# Patient Record
Sex: Male | Born: 1935 | Race: Black or African American | Hispanic: No | Smoking: Former smoker
Health system: Southern US, Community
[De-identification: ages and names within clinical notes are randomized; demographics above are authoritative.]

## PROBLEM LIST (undated history)

## (undated) DIAGNOSIS — I10 Essential (primary) hypertension: Secondary | ICD-10-CM

## (undated) HISTORY — PX: EYE SURGERY: SHX253

## (undated) HISTORY — DX: Essential (primary) hypertension: I10

---

## 2012-01-03 ENCOUNTER — Ambulatory Visit (INDEPENDENT_AMBULATORY_CARE_PROVIDER_SITE_OTHER): Payer: Medicare Other | Admitting: Family Medicine

## 2012-01-03 VITALS — BP 136/88 | HR 61 | Temp 98.9°F | Resp 18 | Ht 72.0 in | Wt 191.0 lb

## 2012-01-03 DIAGNOSIS — T6391XA Toxic effect of contact with unspecified venomous animal, accidental (unintentional), initial encounter: Secondary | ICD-10-CM

## 2012-01-03 DIAGNOSIS — T63481A Toxic effect of venom of other arthropod, accidental (unintentional), initial encounter: Secondary | ICD-10-CM

## 2012-01-03 DIAGNOSIS — M7989 Other specified soft tissue disorders: Secondary | ICD-10-CM

## 2012-01-03 MED ORDER — PREDNISONE 20 MG PO TABS: ORAL_TABLET | ORAL | Status: DC

## 2012-01-03 MED ORDER — TRIAMCINOLONE ACETONIDE 0.1 % EX CREA: TOPICAL_CREAM | Freq: Two times a day (BID) | CUTANEOUS | Status: DC

## 2012-01-03 NOTE — Patient Instructions (Addendum)
Bee, Wasp, or Hornet Sting Your caregiver has diagnosed you as having an insect sting. An insect sting appears as a red lump in the skin that sometimes has a tiny hole in the center, or it may have a stinger in the center of the wound. The most common stings are from wasps, hornets and bees. Individuals have different reactions to insect stings.  A normal reaction may cause pain, swelling, and redness around the sting site.  A localized allergic reaction may cause swelling and redness that extends beyond the sting site.  A large local reaction may continue to develop over the next 12 to 36 hours.  On occasion, the reactions can be severe (anaphylactic reaction). An anaphylactic reaction may cause wheezing; difficulty breathing; chest pain; fainting; raised, itchy, red patches on the skin; a sick feeling to your stomach (nausea); vomiting; cramping; or diarrhea. If you have had an anaphylactic reaction to an insect sting in the past, you are more likely to have one again. HOME CARE INSTRUCTIONS   With bee stings, a small sac of poison is left in the wound. Brushing across this with something such as a credit card, or anything similar, will help remove this and decrease the amount of the reaction. This same procedure will not help a wasp sting as they do not leave behind a stinger and poison sac.  Apply a cold compress for 10 to 20 minutes every hour for 1 to 2 days, depending on severity, to reduce swelling and itching.  To lessen pain, a paste made of water and baking soda may be rubbed on the bite or sting and left on for 5 minutes.  To relieve itching and swelling, you may use take medication or apply medicated creams or lotions as directed.  Only take over-the-counter or prescription medicines for pain, discomfort, or fever as directed by your caregiver.  Wash the sting site daily with soap and water. Apply antibiotic ointment on the sting site as directed.  If you suffered a severe  reaction:  If you did not require hospitalization, an adult will need to stay with you for 24 hours in case the symptoms return.  You may need to wear a medical bracelet or necklace stating the allergy.  You and your family need to learn when and how to use an anaphylaxis kit or epinephrine injection.  If you have had a severe reaction before, always carry your anaphylaxis kit with you. SEEK MEDICAL CARE IF:   None of the above helps within 2 to 3 days.  The area becomes red, warm, tender, and swollen beyond the area of the bite or sting.  You have an oral temperature above 102 F (38.9 C). SEEK IMMEDIATE MEDICAL CARE IF:  You have symptoms of an allergic reaction which are:  Wheezing.  Difficulty breathing.  Chest pain.  Lightheadedness or fainting.  Itchy, raised, red patches on the skin.  Nausea, vomiting, cramping or diarrhea. ANY OF THESE SYMPTOMS MAY REPRESENT A SERIOUS PROBLEM THAT IS AN EMERGENCY. Do not wait to see if the symptoms will go away. Get medical help right away. Call your local emergency services (911 in [place] DO NOT drive yourself to the hospital. MAKE SURE YOU:   Understand these instructions.  Will watch your condition.  Will get help right away if you are not doing well or get worse. Document Released: 03/19/2005 Document Revised: 06/11/2011 Document Reviewed: 09/03/2009 ExitCare Patient Information 2013 [employer].   TAKE BENADRYL 25 MG (DIPHENHYDRAMINE) ONE EVERY  4-6 HOURS.  I think I mislabled the Prednisone: Prednisone  20 mg :  Take 3 pills daily for 2 days, then 2 daily for 2 days, then 1 daily for 2 days

## 2012-01-03 NOTE — Progress Notes (Signed)
S: Took glove off and something had crawled into the glove and when he put it back on in about 15 min he got bit or stung 3 times before he could get the glove off.  He thought it was a spider.    Healthy.  On amlodipine 5 .  No other prescription meds.  Not diabetic.  O; Right hand swollen and red.  3 sting spots and converge with the swelling  A:  Probable yellowjacket stings  P; Prednisone Benadryl Triamcinolone

## 2012-01-15 ENCOUNTER — Other Ambulatory Visit: Payer: Self-pay | Admitting: Radiology

## 2012-02-20 ENCOUNTER — Telehealth: Payer: Self-pay

## 2012-02-20 ENCOUNTER — Ambulatory Visit (INDEPENDENT_AMBULATORY_CARE_PROVIDER_SITE_OTHER): Payer: Medicare Other | Admitting: Family Medicine

## 2012-02-20 VITALS — BP 117/79 | HR 72 | Temp 98.3°F | Resp 18 | Ht 71.5 in | Wt 202.8 lb

## 2012-02-20 DIAGNOSIS — T63481A Toxic effect of venom of other arthropod, accidental (unintentional), initial encounter: Secondary | ICD-10-CM

## 2012-02-20 DIAGNOSIS — T6391XA Toxic effect of contact with unspecified venomous animal, accidental (unintentional), initial encounter: Secondary | ICD-10-CM

## 2012-02-20 MED ORDER — PREDNISONE 20 MG PO TABS: ORAL_TABLET | ORAL | Status: DC

## 2012-02-20 MED ORDER — TRIAMCINOLONE ACETONIDE 0.1 % EX CREA: TOPICAL_CREAM | Freq: Two times a day (BID) | CUTANEOUS | Status: DC

## 2012-02-20 NOTE — Patient Instructions (Signed)
Take the pills as directed Use the cream twice a day on the rash.

## 2012-02-20 NOTE — Progress Notes (Signed)
Subjective: Patient was doing yard work last week and got stung by yellow jacket is Noah Waters. There were swollen all over the place but only got blood stain. It is continued to be swollen and itching him.  Objective: Puffy area right chin and neck with rash.  Assessment: Yellow jacket sting  Plan: Prednisone Astrid Drafts , Talked about getting a yellow jacket

## 2012-02-20 NOTE — Telephone Encounter (Signed)
Patient states that he was stung by a bee on his chin, and is wanting to know if Dr Alwyn Ren can call in the same prescription that he received in October.   Pharmacy: [facility] on [address]    Pt Phone#:[phone]

## 2012-04-16 ENCOUNTER — Telehealth: Payer: Self-pay

## 2012-04-16 NOTE — Telephone Encounter (Signed)
PT SAYS HE HAS BEEN RECEIVING HIS MEDICINE THROUGH MAIL ORDER AND FOR SOME REASON IT HAS DR. SANDER'S NAME ON IT INSTEAD OF DR. HOPPER.  WANTS TO KNOW IF WE CAN HAVE THIS FIXED.  [facility] NUMBER IS [phone].  YOU CAN REACH THE PT AT [phone]

## 2012-04-17 NOTE — Telephone Encounter (Signed)
It looks like Dr Allyne Gee sent this in for patient, not [place]. I called patient to advise. He states he will come in to see Dr Alwyn Ren to have office visit and labs done.

## 2012-07-15 ENCOUNTER — Ambulatory Visit (INDEPENDENT_AMBULATORY_CARE_PROVIDER_SITE_OTHER): Payer: Medicare Other | Admitting: Family Medicine

## 2012-07-15 VITALS — BP 124/86 | HR 64 | Temp 98.0°F | Resp 16 | Ht 71.0 in | Wt 197.0 lb

## 2012-07-15 DIAGNOSIS — L259 Unspecified contact dermatitis, unspecified cause: Secondary | ICD-10-CM

## 2012-07-15 MED ORDER — METHYLPREDNISOLONE ACETATE 80 MG/ML IJ SUSP
80.0000 mg | Freq: Once | INTRAMUSCULAR | Status: AC
  Administered 2012-07-15: 80 mg via INTRAMUSCULAR

## 2012-07-15 MED ORDER — CLOBETASOL PROPIONATE 0.05 % EX CREA: TOPICAL_CREAM | Freq: Two times a day (BID) | CUTANEOUS | Status: DC

## 2012-07-15 NOTE — Progress Notes (Signed)
Subjective: Patient is here with history of having spread red mulch about a week ago. He scooped it up with his arms. He developed a rash on both arms. The main rash on the arms from the antecubital fossa down represents a punctate maculopapular rash. On the dorsum of the right hand there is an area of linear streaking a little bit more blistering of a rash at the base of the right thumb. This itches somebody just not been going away. He's been using some triamcinolone cream on it  Objective: Rash as described above.  Assessment: Contact dermatitis there is possibly some element of a bite having taken place on his upper forearms.  Plan: Depo-Medrol 80 Clobetasol cream  Return for

## 2012-07-15 NOTE — Patient Instructions (Addendum)
Use the clobetasol cream 2 or 3 times daily on the rash.  Return if problems

## 2012-09-03 ENCOUNTER — Telehealth: Payer: Self-pay

## 2012-09-03 NOTE — Telephone Encounter (Signed)
Patient would like amlodipine called in to [facility] [phone] for refills.

## 2012-09-03 NOTE — Telephone Encounter (Signed)
We do not prescribe this for him  Called him to advise if he wants [place] to do this we must see him for his blood pressure. He will come in tomorrow

## 2012-09-04 ENCOUNTER — Telehealth: Payer: Self-pay

## 2012-09-04 ENCOUNTER — Ambulatory Visit (INDEPENDENT_AMBULATORY_CARE_PROVIDER_SITE_OTHER): Payer: Medicare Other | Admitting: Emergency Medicine

## 2012-09-04 ENCOUNTER — Ambulatory Visit: Payer: Medicare Other

## 2012-09-04 VITALS — BP 112/66 | HR 68 | Temp 97.9°F | Resp 18 | Ht 71.0 in | Wt 192.4 lb

## 2012-09-04 DIAGNOSIS — M25571 Pain in right ankle and joints of right foot: Secondary | ICD-10-CM

## 2012-09-04 DIAGNOSIS — M25579 Pain in unspecified ankle and joints of unspecified foot: Secondary | ICD-10-CM

## 2012-09-04 DIAGNOSIS — S9001XA Contusion of right ankle, initial encounter: Secondary | ICD-10-CM

## 2012-09-04 DIAGNOSIS — S9000XA Contusion of unspecified ankle, initial encounter: Secondary | ICD-10-CM

## 2012-09-04 MED ORDER — MELOXICAM 15 MG PO TABS: 15.0000 mg | ORAL_TABLET | Freq: Every day | ORAL | Status: DC

## 2012-09-04 MED ORDER — AMLODIPINE BESYLATE 5 MG PO TABS: 5.0000 mg | ORAL_TABLET | Freq: Every day | ORAL | Status: DC

## 2012-09-04 NOTE — Patient Instructions (Addendum)
Contusion A contusion is a deep bruise. Contusions are the result of an injury that caused bleeding under the skin. The contusion may turn blue, purple, or yellow. Minor injuries will give you a painless contusion, but more severe contusions may stay painful and swollen for a few weeks.  CAUSES  A contusion is usually caused by a blow, trauma, or direct force to an area of the body. SYMPTOMS   Swelling and redness of the injured area.  Bruising of the injured area.  Tenderness and soreness of the injured area.  Pain. DIAGNOSIS  The diagnosis can be made by taking a history and physical exam. An X-ray, CT scan, or MRI may be needed to determine if there were any associated injuries, such as fractures. TREATMENT  Specific treatment will depend on what area of the body was injured. In general, the best treatment for a contusion is resting, icing, elevating, and applying cold compresses to the injured area. Over-the-counter medicines may also be recommended for pain control. Ask your caregiver what the best treatment is for your contusion. HOME CARE INSTRUCTIONS   Put ice on the injured area.  Put ice in a plastic bag.  Place a towel between your skin and the bag.  Leave the ice on for 15-20 minutes, 3-4 times a day.  Only take over-the-counter or prescription medicines for pain, discomfort, or fever as directed by your caregiver. Your caregiver may recommend avoiding anti-inflammatory medicines (aspirin, ibuprofen, and naproxen) for 48 hours because these medicines may increase bruising.  Rest the injured area.  If possible, elevate the injured area to reduce swelling. SEEK IMMEDIATE MEDICAL CARE IF:   You have increased bruising or swelling.  You have pain that is getting worse.  Your swelling or pain is not relieved with medicines. MAKE SURE YOU:   Understand these instructions.  Will watch your condition.  Will get help right away if you are not doing well or get  worse. Document Released: 12/27/2004 Document Revised: 06/11/2011 Document Reviewed: 01/22/2011 ExitCare Patient Information 2014 [employer].  

## 2012-09-04 NOTE — Progress Notes (Signed)
[facility], [place] [address] [place] [address] [phone]  Date:  09/04/2012   Name:  Noah Waters   DOB:  12/21/1924   MRN:  [id number]  PCP:  Janace Hoard, MD    Chief Complaint: Ankle Pain   History of Present Illness:  Noah Waters is a 79 y.o. very pleasant male patient who presents with the following:  Bumped his ankle a month ago on a tiller and has persistent pain in the lateral ankle.  Says he had a laceration on his lower leg that occurred at the same time and has healed.  Worse when walks.  Some swelling in lateral ankle.  Denies any fever or chills. No improvement with over the counter medications or other home remedies. Denies other complaint or health concern today.   There are no active problems to display for this patient.   Past Medical History  Diagnosis Date  . Hypertension     Past Surgical History  Procedure Laterality Date  . Eye surgery      History  Substance Use Topics  . Smoking status: Former Smoker    Quit date: 04/12/1948  . Smokeless tobacco: Not on file  . Alcohol Use: No    No family history on file.  No Known Allergies  Medication list has been reviewed and updated.  Current Outpatient Prescriptions on File Prior to Visit  Medication Sig Dispense Refill  . amLODipine (NORVASC) 5 MG tablet Take 5 mg by mouth daily.       No current facility-administered medications on file prior to visit.    Review of Systems:  As per HPI, otherwise negative.    Physical Examination: Filed Vitals:   09/04/12 0825  BP: 112/66  Pulse: 68  Temp: 97.9 F (36.6 C)  Resp: 18   Filed Vitals:   09/04/12 0825  Height: 5\' 11"  (1.803 m)  Weight: 192 lb 6.4 oz (87.272 kg)   Body mass index is 26.85 kg/(m^2). Ideal Body Weight: Weight in (lb) to have BMI = 25: 178.9   GEN: WDWN, NAD, Non-toxic, Alert & Oriented x 3 HEENT: Atraumatic, Normocephalic.  Ears and Nose: No external deformity. EXTR: No  clubbing/cyanosis/edema NEURO: Normal gait.  PSYCH: Normally interactive. Conversant. Not depressed or anxious appearing.  Calm demeanor.  ANKLE:  Tender, erythematous lateral malleolus.  No effusion or limitation of movement.  Assessment and Plan: Ankle contusion   Signed,  Phillips Odor, MD   [facility] reading (PRIMARY) by  Dr. Dareen Piano.  negative.

## 2012-09-04 NOTE — Telephone Encounter (Signed)
Spoke w/Dr Dareen Piano, who OK'd RF for 90 days since BP is well-controlled, but pt needs PE/LABS for add'l RFs. LMOM for pt to CB to advise him.

## 2012-09-05 NOTE — Telephone Encounter (Signed)
Spoke with pt and informed rx sent to pharmacy and that he would need to RTC for any additional refills. He understood and will return before he runs out of his meds.

## 2012-11-28 ENCOUNTER — Ambulatory Visit (INDEPENDENT_AMBULATORY_CARE_PROVIDER_SITE_OTHER): Payer: Medicare Other | Admitting: Emergency Medicine

## 2012-11-28 VITALS — BP 126/80 | HR 59 | Temp 98.5°F | Resp 18 | Ht 71.0 in | Wt 198.0 lb

## 2012-11-28 DIAGNOSIS — I10 Essential (primary) hypertension: Secondary | ICD-10-CM | POA: Insufficient documentation

## 2012-11-28 LAB — POCT CBC
Granulocyte percent: 66.7 %G (ref 37–80)
HCT, POC: 51.1 % (ref 43.5–53.7)
Lymph, poc: 1.3 (ref 0.6–3.4)
MCHC: 32.3 g/dL (ref 31.8–35.4)
MCV: 94.4 fL (ref 80–97)
MID (cbc): 0.5 (ref 0–0.9)
POC Granulocyte: 3.5 (ref 2–6.9)
POC LYMPH PERCENT: 23.9 %L (ref 10–50)
Platelet Count, POC: 183 10*3/uL (ref 142–424)
RDW, POC: 13.8 %

## 2012-11-28 LAB — POCT URINALYSIS DIPSTICK
Protein, UA: NEGATIVE
Spec Grav, UA: 1.01
Urobilinogen, UA: 0.2
pH, UA: 6

## 2012-11-28 MED ORDER — AMLODIPINE BESYLATE 5 MG PO TABS: 5.0000 mg | ORAL_TABLET | Freq: Every day | ORAL | Status: DC

## 2012-11-28 NOTE — Progress Notes (Signed)
[facility] [address], [place] [address] [phone]  Date:  11/28/2012   Name:  Noah Waters   DOB:  08/28/1924   MRN:  [id number]  PCP:  Janace Hoard, MD    Chief Complaint: Medication Refill   History of Present Illness:  Noah Waters is a 79 y.o. very pleasant male patient who presents with the following:  For BP check.  Tolerating medication well with no adverse effect. No current complaints.  Appetite good,  Weight stable.     There are no active problems to display for this patient.   Past Medical History  Diagnosis Date  . Hypertension     Past Surgical History  Procedure Laterality Date  . Eye surgery      History  Substance Use Topics  . Smoking status: Former Smoker    Quit date: 04/12/1948  . Smokeless tobacco: Not on file  . Alcohol Use: No    History reviewed. No pertinent family history.  No Known Allergies  Medication list has been reviewed and updated.  Current Outpatient Prescriptions on File Prior to Visit  Medication Sig Dispense Refill  . amLODipine (NORVASC) 5 MG tablet Take 1 tablet (5 mg total) by mouth daily.  90 tablet  0  . Multiple Vitamins-Minerals (MULTIVITAMIN WITH MINERALS) tablet Take 1 tablet by mouth daily.      . Vitamin D, Ergocalciferol, (DRISDOL) 50000 UNITS CAPS Take 50,000 Units by mouth.      . meloxicam (MOBIC) 15 MG tablet Take 1 tablet (15 mg total) by mouth daily.  30 tablet  1   No current facility-administered medications on file prior to visit.    Review of Systems:  As per HPI, otherwise negative.    Physical Examination: Filed Vitals:   11/28/12 1104  BP: 126/80  Pulse: 59  Temp: 98.5 F (36.9 C)  Resp: 18   Filed Vitals:   11/28/12 1104  Height: 5\' 11"  (1.803 m)  Weight: 198 lb (89.812 kg)   Body mass index is 27.63 kg/(m^2). Ideal Body Weight: Weight in (lb) to have BMI = 25: 178.9  GEN: WDWN, NAD, Non-toxic, A & O x 3 HEENT: Atraumatic, Normocephalic. Neck  supple. No masses, No LAD. Ears and Nose: No external deformity. CV: RRR, No M/G/R. No JVD. No thrill. No extra heart sounds. PULM: CTA B, no wheezes, crackles, rhonchi. No retractions. No resp. distress. No accessory muscle use. ABD: S, NT, ND, +BS. No rebound. No HSM. EXTR: No c/c/e NEURO Normal gait.  PSYCH: Normally interactive. Conversant. Not depressed or anxious appearing.  Calm demeanor.    Assessment and Plan: Hypertension Labs Follow up based on labs   Signed,  Phillips Odor, MD

## 2012-11-29 LAB — LIPID PANEL
Cholesterol: 175 mg/dL (ref 0–200)
HDL: 48 mg/dL (ref >39)
Total CHOL/HDL Ratio: 3.6 Ratio
Triglycerides: 47 mg/dL (ref <150)
VLDL: 9 mg/dL (ref 0–40)

## 2012-11-29 LAB — COMPREHENSIVE METABOLIC PANEL
Alkaline Phosphatase: 70 U/L (ref 39–117)
BUN: 27 mg/dL — ABNORMAL HIGH (ref 6–23)
CO2: 28 mEq/L (ref 19–32)
Creat: 1.22 mg/dL (ref 0.50–1.35)
Glucose, Bld: 85 mg/dL (ref 70–99)
Total Bilirubin: 1.2 mg/dL (ref 0.3–1.2)

## 2013-07-15 ENCOUNTER — Other Ambulatory Visit: Payer: Self-pay

## 2013-07-15 MED ORDER — AMLODIPINE BESYLATE 5 MG PO TABS: 5.0000 mg | ORAL_TABLET | Freq: Every day | ORAL | Status: DC

## 2013-10-20 ENCOUNTER — Ambulatory Visit (INDEPENDENT_AMBULATORY_CARE_PROVIDER_SITE_OTHER): Payer: Medicare Other | Admitting: Family Medicine

## 2013-10-20 VITALS — BP 130/70 | HR 60 | Temp 98.8°F | Resp 18 | Ht 70.0 in | Wt 194.2 lb

## 2013-10-20 DIAGNOSIS — Z79899 Other long term (current) drug therapy: Secondary | ICD-10-CM

## 2013-10-20 DIAGNOSIS — I10 Essential (primary) hypertension: Secondary | ICD-10-CM

## 2013-10-20 LAB — CBC
HCT: 45.7 % (ref 39.0–52.0)
HEMOGLOBIN: 15.6 g/dL (ref 13.0–17.0)
MCH: 29.4 pg (ref 26.0–34.0)
MCHC: 34.1 g/dL (ref 30.0–36.0)
MCV: 86.2 fL (ref 78.0–100.0)
Platelets: 161 10*3/uL (ref 150–400)
RBC: 5.3 MIL/uL (ref 4.22–5.81)
RDW: 14.5 % (ref 11.5–15.5)
WBC: 4.4 10*3/uL (ref 4.0–10.5)

## 2013-10-20 LAB — COMPREHENSIVE METABOLIC PANEL
ALBUMIN: 4.4 g/dL (ref 3.5–5.2)
ALK PHOS: 60 U/L (ref 39–117)
ALT: 12 U/L (ref 0–53)
AST: 21 U/L (ref 0–37)
BUN: 9 mg/dL (ref 6–23)
CALCIUM: 9.3 mg/dL (ref 8.4–10.5)
CHLORIDE: 102 meq/L (ref 96–112)
CO2: 29 mEq/L (ref 19–32)
Creat: 1.11 mg/dL (ref 0.50–1.35)
Glucose, Bld: 89 mg/dL (ref 70–99)
POTASSIUM: 4.3 meq/L (ref 3.5–5.3)
Sodium: 140 mEq/L (ref 135–145)
Total Bilirubin: 0.9 mg/dL (ref 0.2–1.2)
Total Protein: 7.1 g/dL (ref 6.0–8.3)

## 2013-10-20 NOTE — Progress Notes (Signed)
Subjective:    Patient ID: Noah ConnorsJerry D Masek, male    DOB: 06/18/1924, 80 y.o.   MRN: [id number]  This chart was scribed for Noah MochaEva N Shaw, MD by Julian HyMorgan Graham, ED Scribe. The patient was seen in Room 10. The patient's care was started at 9:18 AM.   Chief Complaint  Patient presents with  . Medication Refill    amlopipine    HPI Past Medical History  Diagnosis Date  . Hypertension    Current Outpatient Prescriptions on File Prior to Visit  Medication Sig Dispense Refill  . amLODipine (NORVASC) 5 MG tablet Take 1 tablet (5 mg total) by mouth daily. PATIENT NEEDS OFFICE VISIT FOR ADDITIONAL REFILLS  90 tablet  0  . meloxicam (MOBIC) 15 MG tablet Take 1 tablet (15 mg total) by mouth daily.  30 tablet  1  . Multiple Vitamins-Minerals (MULTIVITAMIN WITH MINERALS) tablet Take 1 tablet by mouth daily.      . Vitamin D, Ergocalciferol, (DRISDOL) 50000 UNITS CAPS Take 50,000 Units by mouth.       No current facility-administered medications on file prior to visit.   No Known Allergies HPI Comments: Noah Waters is a 80 y.o. male who [facility]. Pt denies having any problems. Pt states he hasn't eaten today. Pt denies chest pains or tightness, SOB, dizziness, headaches, congestion, leg swelling, fever, chills, nausea and vomiting.   Pt states he has had irregular heart rate for many years. Pt has worked with the [employer] his entire life. Reports he has had to have annual [employer] medical examination due to his history of irregular heart rate. It has always been benign and no intervention has ever been needed. No prior paper chart for pt available. No prior EKG readings available.   Last seen on 11/23/12 by Dr. Alwyn RenHopper. Pt had a complete lab panel.   Review of Systems  Constitutional: Negative for fever and chills.  HENT: Negative for congestion.   Respiratory: Negative for shortness of breath.   Cardiovascular: Negative for chest pain and leg swelling.    Gastrointestinal: Negative for nausea and vomiting.  Neurological: Negative for dizziness, weakness and headaches.      BP 130/70  Pulse 60  Temp(Src) 98.8 F (37.1 C) (Oral)  Resp 18  Ht 5\' 10"  (1.778 m)  Wt 194 lb 3.2 oz (88.089 kg)  BMI 27.86 kg/m2  SpO2 97% Objective:   Physical Exam  Nursing note and vitals reviewed. Constitutional: He is oriented to person, place, and time. He appears well-developed and well-nourished. No distress.  HENT:  Head: Normocephalic and atraumatic.  Eyes: Conjunctivae and EOM are normal.  Neck: Neck supple. No tracheal deviation present.  Cardiovascular: S1 normal and S2 normal.  An irregular rhythm present.  Extrasystoles are present.  Pulmonary/Chest: Effort normal. No respiratory distress.  Musculoskeletal: Normal range of motion.  Neurological: He is alert and oriented to person, place, and time.  Skin: Skin is warm and dry.  Psychiatric: He has a normal mood and affect. His behavior is normal.     Primary EKG Reading by Dr. Clelia CroftShaw.  Assessment & Plan:  COORDINATION OF CARE: 9:18 AM- Depending on [facility] EKG history will decide if he needs an EKG today to establish his baseline. Patient informed of current plan for treatment and evaluation and agrees with plan at this time.  Essential hypertension, benign - Plan: Comprehensive metabolic panel, CBC, PSA, Medicare, EKG 12-Lead  Encounter for  long-term (current) use of other medications - Plan: Comprehensive metabolic panel, CBC, PSA, Medicare, EKG 12-Lead  No orders of the defined types were placed in this encounter.    I personally performed the services described in this documentation, which was scribed in my presence. The recorded information has been reviewed and considered, and addended by me as needed.  Norberto Sorenson, MD MPH

## 2013-10-21 LAB — PSA, MEDICARE: PSA: 2.36 ng/mL (ref <=4.00)

## 2014-01-13 ENCOUNTER — Telehealth: Payer: Self-pay

## 2014-01-13 MED ORDER — AMLODIPINE BESYLATE 5 MG PO TABS: 5.0000 mg | ORAL_TABLET | Freq: Every day | ORAL | Status: DC

## 2014-01-13 NOTE — Telephone Encounter (Signed)
Spoke to pt- he needed his Norvasc refilled. Sent in 3 month supply- pt will have appt in January.

## 2014-01-13 NOTE — Telephone Encounter (Signed)
Pt states his mail order pharmacy has sent over several requests for renewal of his meds but that we have not responded. Pt is running out of meds,  Please call pt at [phone]

## 2014-02-17 ENCOUNTER — Encounter: Payer: Self-pay | Admitting: Family Medicine

## 2014-06-05 ENCOUNTER — Ambulatory Visit (INDEPENDENT_AMBULATORY_CARE_PROVIDER_SITE_OTHER): Payer: Medicare Other | Admitting: Internal Medicine

## 2014-06-05 VITALS — BP 160/86 | HR 59 | Temp 97.6°F | Resp 16 | Ht 71.0 in | Wt 191.2 lb

## 2014-06-05 DIAGNOSIS — I10 Essential (primary) hypertension: Secondary | ICD-10-CM

## 2014-06-05 MED ORDER — AMLODIPINE BESYLATE 5 MG PO TABS: 5.0000 mg | ORAL_TABLET | Freq: Every day | ORAL | Status: DC

## 2014-06-05 NOTE — Progress Notes (Signed)
   Subjective:    Patient ID: Noah Waters, male    DOB: 03/01/1925, 81 y.o.   MRN: [id number] This chart was scribed for Tonye Pearsonobert P Makalyn Lennox, MD by [place] Peace, ED Scribe. The patient was seen in RM11. The patient's care was started at 9:17 AM.  HPI HPI Comments: Noah Waters is a 81 y.o. male who [facility] the [facility].  Patient Active Problem List   Diagnosis Date Noted  . Essential hypertension, benign 11/28/2012   Medication  amLODipine (NORVASC) 5 MG tablet    He is doing very well and has no current symptoms. Very optimistic about life. Former [employer]. Attributes his good health to" love" Some relatives over 100. Despite the fact that the computer does not have records about his health maintenance, he insists that he has up-to-date on tetanus, colonoscopy, pneumonia vaccine, and flu vaccine. 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  Review of Systems  Constitutional: Negative for activity change, appetite change, fatigue and unexpected weight change.  HENT: Negative for trouble swallowing.   Eyes: Negative for visual disturbance.  Respiratory: Negative for cough and shortness of breath.   Cardiovascular: Negative for chest pain, palpitations and leg swelling.  Gastrointestinal: Negative for abdominal pain.  Genitourinary: Negative for difficulty urinating.  Neurological: Negative for speech difficulty, weakness and headaches.  Psychiatric/Behavioral: Negative for behavioral problems and sleep disturbance.       Objective:   Physical Exam BP 160/86 mmHg  Pulse 59  Temp(Src) 97.6 F (36.4 C) (Oral)  Resp 16  Ht 5\' 11"  (1.803 m)  Wt 191 lb 3.2 oz (86.728 kg)  BMI 26.68 kg/m2  SpO2 96% HEENT clear Heart regular without murmur No carotid bruits Extremities without edema Good peripheral pulses Cranial nerves II through XII intact Mood good affect appropriate       Assessment & Plan:  I have completed  the patient encounter in its entirety as documented by the scribe, with editing by me where necessary. Augustine Brannick P. Merla Richesoolittle, M.D.  Essential hypertension, benign    Meds ordered this encounter  Medications  . amLODipine (NORVASC) 5 MG tablet    Sig: Take 1 tablet (5 mg total) by mouth daily.    Dispense:  90 tablet    Refill:  3   There should be no need for lab work at this visit Follow-up one year or sooner for any complaint It would be nice to add his outside records

## 2014-06-07 ENCOUNTER — Ambulatory Visit: Payer: Self-pay | Admitting: Family Medicine

## 2014-12-14 IMAGING — CR DG ANKLE COMPLETE 3+V*R*
3 series · 3 of 3 positions shown · non-contrast
Comparison: None.

CLINICAL DATA: Ankle swelling

RIGHT ANKLE - COMPLETE 3+ VIEW

[AP]
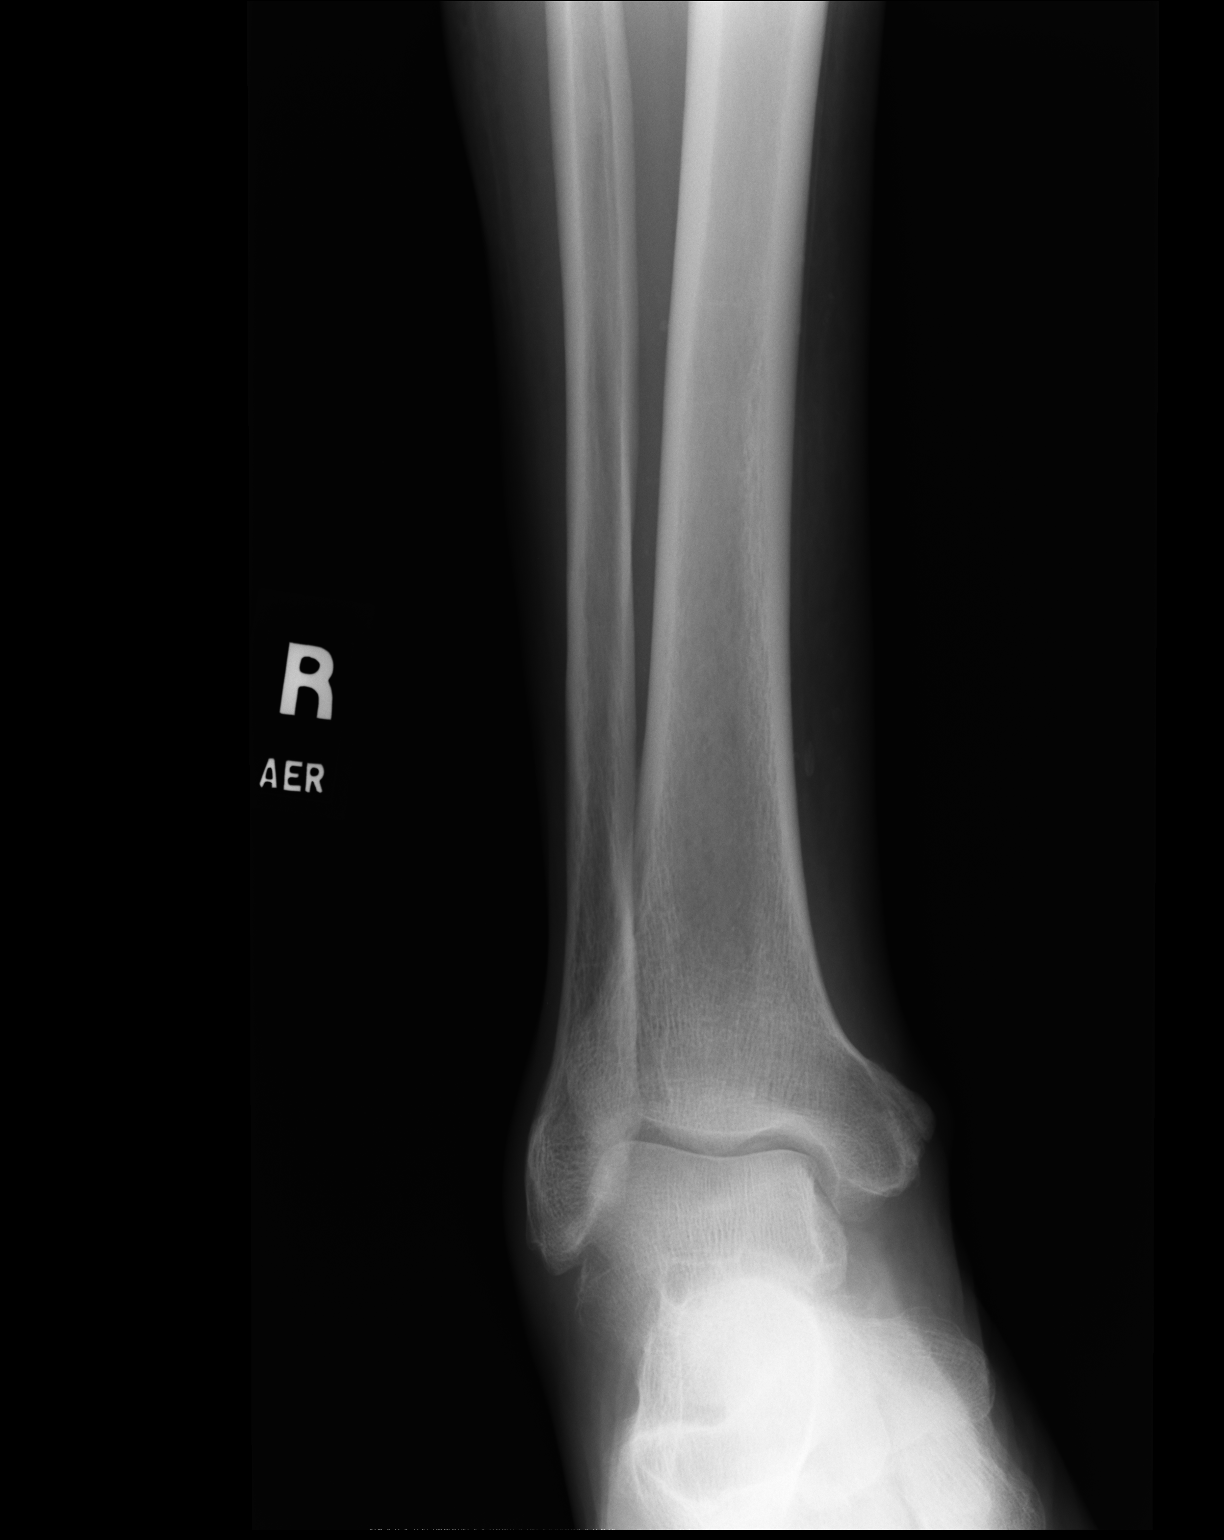

[ap obl int rot]
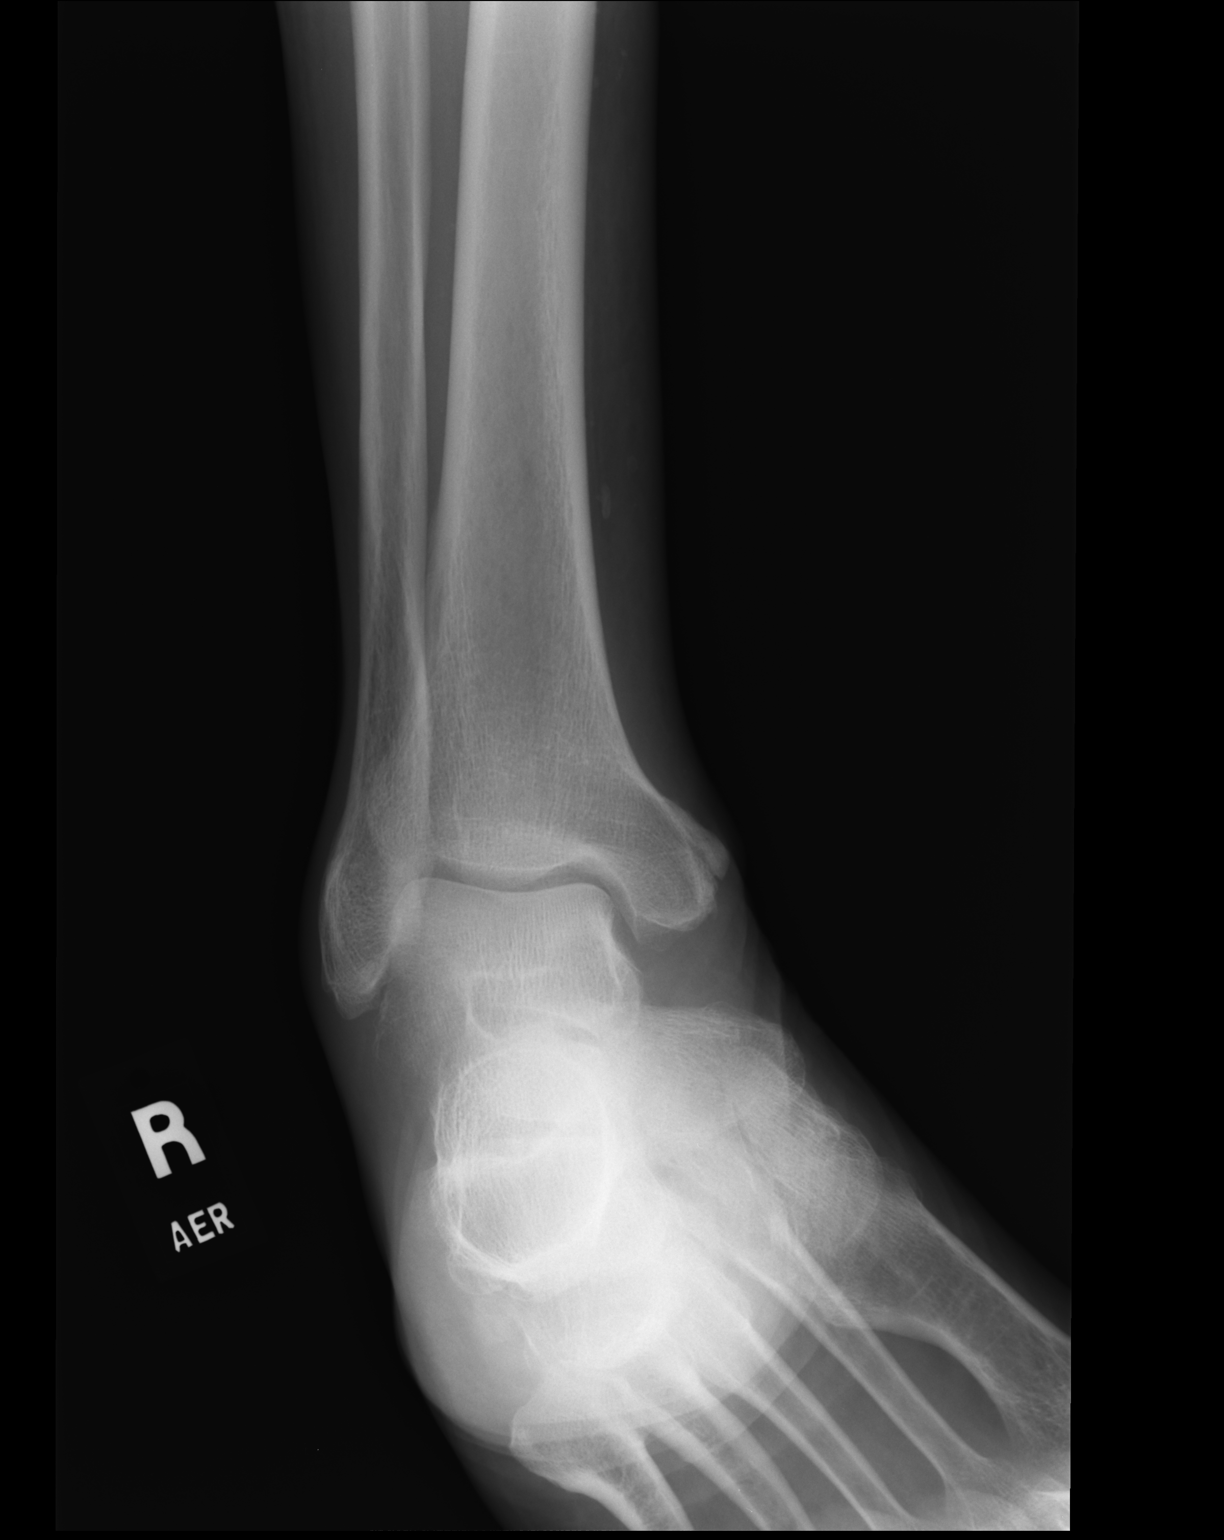

[lateral]
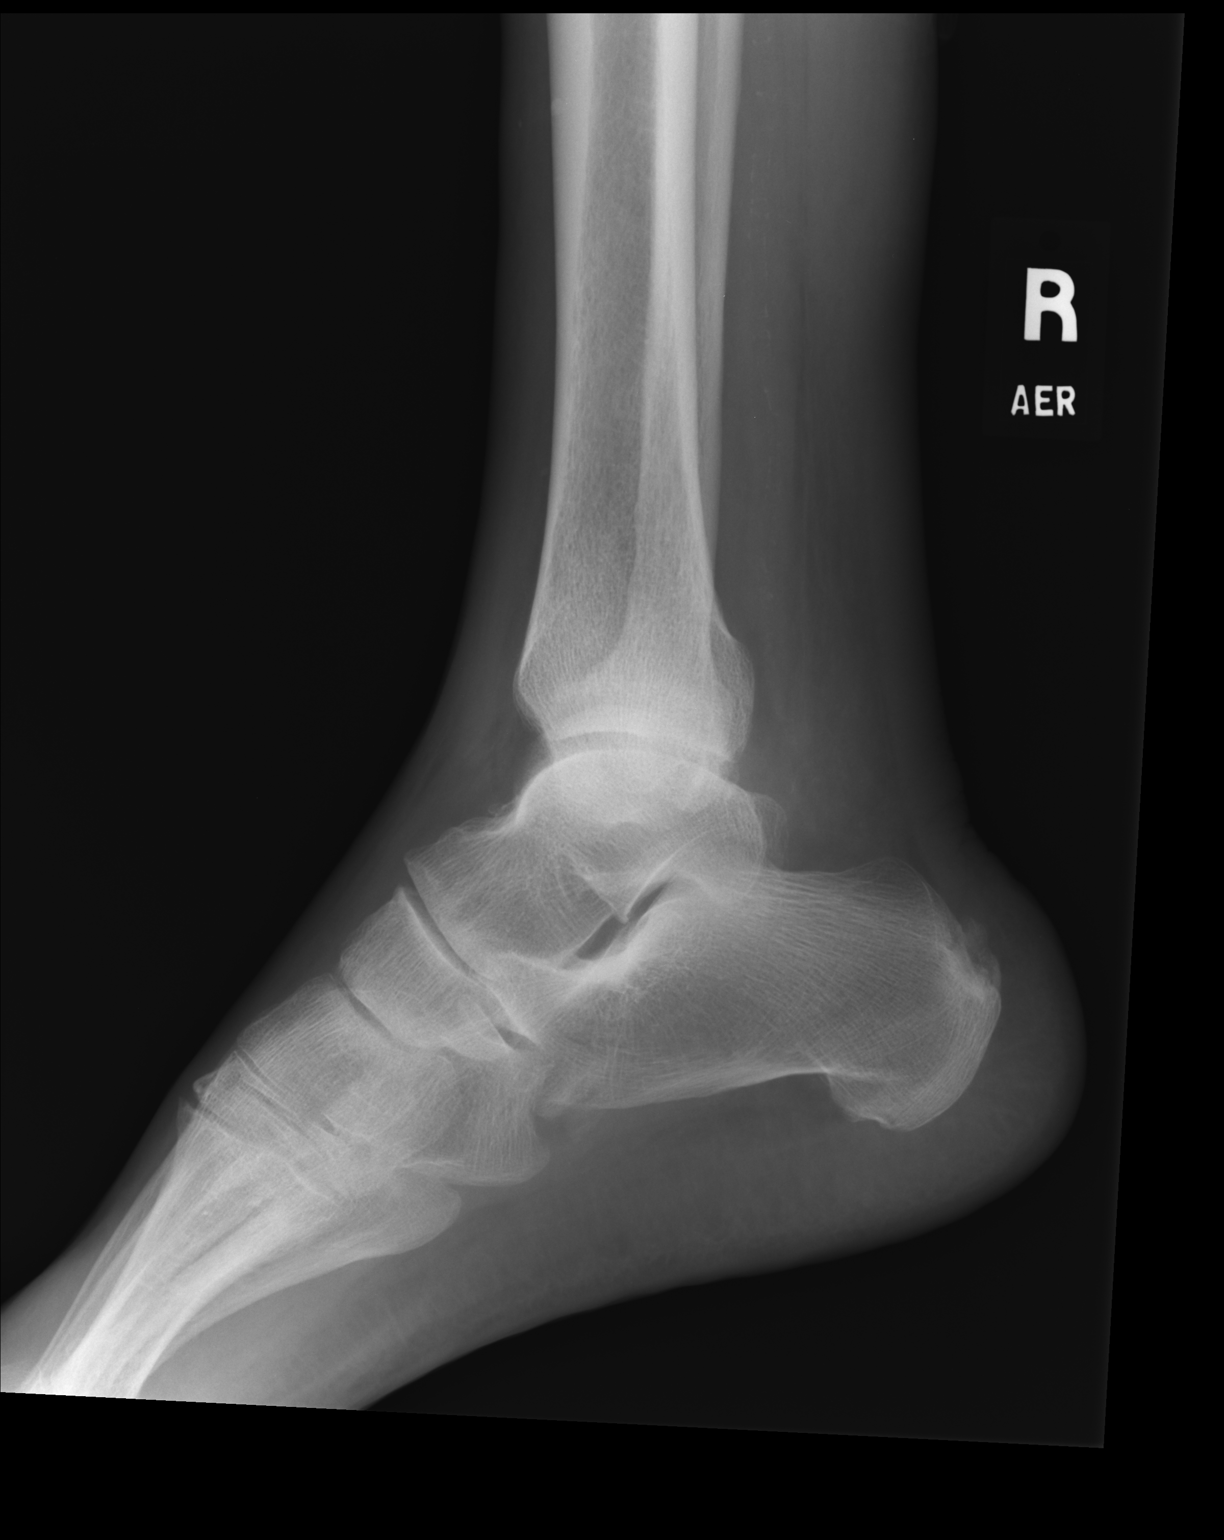

[3 of 3 positions shown; findings below may reference images not displayed]

FINDINGS: Frontal, oblique, and lateral views were obtained.  There
is no fracture or effusion.  Ankle mortise appears intact.  No
erosive change.  There is minimal spurring arising from the
inferior and posterior calcaneus.
IMPRESSION: No fracture.  Mortise intact.

## 2015-01-23 DIAGNOSIS — Z23 Encounter for immunization: Secondary | ICD-10-CM | POA: Diagnosis not present

## 2015-03-14 DIAGNOSIS — S76912A Strain of unspecified muscles, fascia and tendons at thigh level, left thigh, initial encounter: Secondary | ICD-10-CM | POA: Diagnosis not present

## 2015-03-21 ENCOUNTER — Other Ambulatory Visit (HOSPITAL_COMMUNITY): Payer: Self-pay | Admitting: Orthopedic Surgery

## 2015-03-21 DIAGNOSIS — M79662 Pain in left lower leg: Secondary | ICD-10-CM

## 2015-03-21 DIAGNOSIS — S76912D Strain of unspecified muscles, fascia and tendons at thigh level, left thigh, subsequent encounter: Secondary | ICD-10-CM | POA: Diagnosis not present

## 2015-03-21 DIAGNOSIS — M7989 Other specified soft tissue disorders: Principal | ICD-10-CM

## 2015-03-22 ENCOUNTER — Ambulatory Visit (HOSPITAL_COMMUNITY)
Admission: RE | Admit: 2015-03-22 | Discharge: 2015-03-22 | Disposition: A | Discharge status: Home / Self Care | Payer: Medicare Other | Source: Ambulatory Visit | Attending: Orthopedic Surgery | Admitting: Orthopedic Surgery

## 2015-03-22 DIAGNOSIS — M79662 Pain in left lower leg: Secondary | ICD-10-CM

## 2015-03-22 DIAGNOSIS — M7989 Other specified soft tissue disorders: Secondary | ICD-10-CM | POA: Diagnosis not present

## 2015-03-22 DIAGNOSIS — M79605 Pain in left leg: Secondary | ICD-10-CM | POA: Diagnosis not present

## 2015-03-22 NOTE — Progress Notes (Signed)
VASCULAR LAB PRELIMINARY  PRELIMINARY  PRELIMINARY  PRELIMINARY  Left lower extremity venous duplex completed.    Preliminary report:  Left:  No evidence of DVT, superficial thrombosis, or Baker's cyst.  Dalton Mille, RVS 03/22/2015, 9:31 AM

## 2015-04-06 DIAGNOSIS — M79605 Pain in left leg: Secondary | ICD-10-CM | POA: Diagnosis not present

## 2015-04-21 ENCOUNTER — Ambulatory Visit (INDEPENDENT_AMBULATORY_CARE_PROVIDER_SITE_OTHER): Payer: Medicare Other | Admitting: Emergency Medicine

## 2015-04-21 VITALS — BP 124/62 | HR 63 | Temp 98.5°F | Resp 18 | Ht 71.0 in | Wt 175.0 lb

## 2015-04-21 DIAGNOSIS — I10 Essential (primary) hypertension: Secondary | ICD-10-CM

## 2015-04-21 DIAGNOSIS — M25562 Pain in left knee: Secondary | ICD-10-CM

## 2015-04-21 MED ORDER — MELOXICAM 15 MG PO TABS: 15.0000 mg | ORAL_TABLET | Freq: Every day | ORAL | Status: DC

## 2015-04-21 MED ORDER — AMLODIPINE BESYLATE 5 MG PO TABS: 5.0000 mg | ORAL_TABLET | Freq: Every day | ORAL | Status: DC

## 2015-04-21 MED ORDER — TRAMADOL HCL 50 MG PO TABS: 50.0000 mg | ORAL_TABLET | Freq: Three times a day (TID) | ORAL | Status: DC | PRN

## 2015-04-21 NOTE — Patient Instructions (Signed)
Hypertension Hypertension, commonly called high blood pressure, is when the force of blood pumping through your arteries is too strong. Your arteries are the blood vessels that carry blood from your heart throughout your body. A blood pressure reading consists of a higher number over a lower number, such as 110/72. The higher number (systolic) is the pressure inside your arteries when your heart pumps. The lower number (diastolic) is the pressure inside your arteries when your heart relaxes. Ideally you want your blood pressure below 120/80. Hypertension forces your heart to work harder to pump blood. Your arteries may become narrow or stiff. Having untreated or uncontrolled hypertension can cause heart attack, stroke, kidney disease, and other problems. RISK FACTORS Some risk factors for high blood pressure are controllable. Others are not.  Risk factors you cannot control include:   Race. You may be at higher risk if you are African American.  Age. Risk increases with age.  Gender. Men are at higher risk than women before age 45 years. After age 65, women are at higher risk than men. Risk factors you can control include:  Not getting enough exercise or physical activity.  Being overweight.  Getting too much fat, sugar, calories, or salt in your diet.  Drinking too much alcohol. SIGNS AND SYMPTOMS Hypertension does not usually cause signs or symptoms. Extremely high blood pressure (hypertensive crisis) may cause headache, anxiety, shortness of breath, and nosebleed. DIAGNOSIS To check if you have hypertension, your health care provider will measure your blood pressure while you are seated, with your arm held at the level of your heart. It should be measured at least twice using the same arm. Certain conditions can cause a difference in blood pressure between your right and left arms. A blood pressure reading that is higher than normal on one occasion does not mean that you need treatment. If  it is not clear whether you have high blood pressure, you may be asked to return on a different day to have your blood pressure checked again. Or, you may be asked to monitor your blood pressure at home for 1 or more weeks. TREATMENT Treating high blood pressure includes making lifestyle changes and possibly taking medicine. Living a healthy lifestyle can help lower high blood pressure. You may need to change some of your habits. Lifestyle changes may include:  Following the DASH diet. This diet is high in fruits, vegetables, and whole grains. It is low in salt, red meat, and added sugars.  Keep your sodium intake below 2,300 mg per day.  Getting at least 30-45 minutes of aerobic exercise at least 4 times per week.  Losing weight if necessary.  Not smoking.  Limiting alcoholic beverages.  Learning ways to reduce stress. Your health care provider may prescribe medicine if lifestyle changes are not enough to get your blood pressure under control, and if one of the following is true:  You are 18-59 years of age and your systolic blood pressure is above 140.  You are 60 years of age or older, and your systolic blood pressure is above 150.  Your diastolic blood pressure is above 90.  You have diabetes, and your systolic blood pressure is over 140 or your diastolic blood pressure is over 90.  You have kidney disease and your blood pressure is above 140/90.  You have heart disease and your blood pressure is above 140/90. Your personal target blood pressure may vary depending on your medical conditions, your age, and other factors. HOME CARE INSTRUCTIONS    Have your blood pressure rechecked as directed by your health care provider.   Take medicines only as directed by your health care provider. Follow the directions carefully. Blood pressure medicines must be taken as prescribed. The medicine does not work as well when you skip doses. Skipping doses also puts you at risk for  problems.  Do not smoke.   Monitor your blood pressure at home as directed by your health care provider. SEEK MEDICAL CARE IF:   You think you are having a reaction to medicines taken.  You have recurrent headaches or feel dizzy.  You have swelling in your ankles.  You have trouble with your vision. SEEK IMMEDIATE MEDICAL CARE IF:  You develop a severe headache or confusion.  You have unusual weakness, numbness, or feel faint.  You have severe chest or abdominal pain.  You vomit repeatedly.  You have trouble breathing. MAKE SURE YOU:   Understand these instructions.  Will watch your condition.  Will get help right away if you are not doing well or get worse.   This information is not intended to replace advice given to you by your health care provider. Make sure you discuss any questions you have with your health care provider.   Document Released: 03/19/2005 Document Revised: 08/03/2014 Document Reviewed: 01/09/2013 Elsevier Interactive Patient Education 2016 Elsevier Inc.  

## 2015-04-21 NOTE — Progress Notes (Signed)
Subjective:  Patient ID: Noah Waters, male    DOB: 1924-04-26  Age: 82 y.o. MRN: [id number]  CC: Medication Refill and Knee Pain   HPI SHAMAN MUSCARELLA presents   He has a history of hypertension and needs refill on his medication. He has a long-standing injury to his knee that was evaluated by orthopedics and is been  Under treatment with Dr. Nolen Mu. He's been taking Mobic with no long-term relief. He has no history of reinjury.   He has not been veryCompliant with follow-up. He has no swelling  Or effusion. He has full active and passive range of motion  History Jalene has a past medical history of Hypertension.   He has past surgical history that includes Eye surgery.   His  family history is not on file.  He   reports that he quit smoking about 67 years ago. He does not have any smokeless tobacco history on file. He reports that he does not drink alcohol or use illicit drugs.  Outpatient Prescriptions Prior to Visit  Medication Sig Dispense Refill  . amLODipine (NORVASC) 5 MG tablet Take 1 tablet (5 mg total) by mouth daily. 90 tablet 3   No facility-administered medications prior to visit.    Social History   Social History  . Marital Status: Widowed    Spouse Name: N/A  . Number of Children: N/A  . Years of Education: N/A   Social History Main Topics  . Smoking status: Former Smoker    Quit date: 04/12/1948  . Smokeless tobacco: None  . Alcohol Use: No  . Drug Use: No  . Sexual Activity: Not Asked   Other Topics Concern  . None   Social History Narrative     Review of Systems  Constitutional: Negative for fever, chills and appetite change.  HENT: Negative for congestion, ear pain, postnasal drip, sinus pressure and sore throat.   Eyes: Negative for pain and redness.  Respiratory: Negative for cough, shortness of breath and wheezing.   Cardiovascular: Negative for leg swelling.  Gastrointestinal: Negative for nausea, vomiting, abdominal pain, diarrhea,  constipation and blood in stool.  Endocrine: Negative for polyuria.  Genitourinary: Negative for dysuria, urgency, frequency and flank pain.  Musculoskeletal: Positive for arthralgias. Negative for gait problem.  Skin: Negative for rash.  Neurological: Negative for weakness and headaches.  Psychiatric/Behavioral: Negative for confusion and decreased concentration. The patient is not nervous/anxious.     Objective:  BP 124/62 mmHg  Pulse 63  Temp(Src) 98.5 F (36.9 C)  Resp 18  Ht  (1.803 m)  Wt 175 lb (79.379 kg)  BMI 24.42 kg/m2  SpO2 96%  Physical Exam  Constitutional: He is oriented to person, place, and time. He appears well-developed and well-nourished. No distress.  HENT:  Head: Normocephalic and atraumatic.  Right Ear: External ear normal.  Left Ear: External ear normal.  Nose: Nose normal.  Eyes: Conjunctivae and EOM are normal. Pupils are equal, round, and reactive to light. No scleral icterus.  Neck: Normal range of motion. Neck supple. No tracheal deviation present.  Cardiovascular: Normal rate, regular rhythm and normal heart sounds.   Pulmonary/Chest: Effort normal. No respiratory distress. He has no wheezes. He has no rales.  Abdominal: He exhibits no mass. There is no tenderness. There is no rebound and no guarding.  Musculoskeletal: He exhibits no edema.  Lymphadenopathy:    He has no cervical adenopathy.  Neurological: He is alert and oriented to person, place, and time.  Coordination normal.  Skin: Skin is warm and dry. No rash noted.  Psychiatric: He has a normal mood and affect. His behavior is normal.      Assessment & Plan:   Brendyn was seen today for medication refill and knee pain.  Diagnoses and all orders for this visit:  Essential hypertension, benign  Left knee pain  Other orders -     amLODipine (NORVASC) 5 MG tablet; Take 1 tablet (5 mg total) by mouth daily. -     meloxicam (MOBIC) 15 MG tablet; Take 1 tablet (15 mg total) by  mouth daily. -     traMADol (ULTRAM) 50 MG tablet; Take 1 tablet (50 mg total) by mouth every 8 (eight) hours as needed.  I have changed Mr. Hally's meloxicam. I am also having him start on traMADol. Additionally, I am having him maintain his amLODipine.  Meds ordered this encounter  Medications  . DISCONTD: meloxicam (MOBIC) 15 MG tablet    Sig: Take 15 mg by mouth daily.  Marland Kitchen amLODipine (NORVASC) 5 MG tablet    Sig: Take 1 tablet (5 mg total) by mouth daily.    Dispense:  90 tablet    Refill:  3  . meloxicam (MOBIC) 15 MG tablet    Sig: Take 1 tablet (15 mg total) by mouth daily.    Dispense:  30 tablet    Refill:  5  . traMADol (ULTRAM) 50 MG tablet    Sig: Take 1 tablet (50 mg total) by mouth every 8 (eight) hours as needed.    Dispense:  30 tablet    Refill:  0     I suggested he follow back up with the orthopedic surgeon  Appropriate red flag conditions were discussed with the patient as well as actions that should be taken.  Patient expressed his understanding.  Follow-up: Return if symptoms worsen or fail to improve.  Carmelina Dane, MD

## 2015-05-05 DIAGNOSIS — M25561 Pain in right knee: Secondary | ICD-10-CM | POA: Diagnosis not present

## 2015-05-05 DIAGNOSIS — M170 Bilateral primary osteoarthritis of knee: Secondary | ICD-10-CM | POA: Diagnosis not present

## 2015-05-05 DIAGNOSIS — M25562 Pain in left knee: Secondary | ICD-10-CM | POA: Diagnosis not present

## 2015-05-10 ENCOUNTER — Other Ambulatory Visit: Payer: Self-pay | Admitting: Emergency Medicine

## 2015-05-10 NOTE — Telephone Encounter (Signed)
If he continues to need this medication, he needs to RTC or see his PCP for re-evaluation.

## 2015-05-11 DIAGNOSIS — M170 Bilateral primary osteoarthritis of knee: Secondary | ICD-10-CM | POA: Diagnosis not present

## 2015-05-11 DIAGNOSIS — M1712 Unilateral primary osteoarthritis, left knee: Secondary | ICD-10-CM | POA: Diagnosis not present

## 2015-05-11 DIAGNOSIS — M5430 Sciatica, unspecified side: Secondary | ICD-10-CM | POA: Diagnosis not present

## 2015-05-11 DIAGNOSIS — R2689 Other abnormalities of gait and mobility: Secondary | ICD-10-CM | POA: Diagnosis not present

## 2015-05-11 DIAGNOSIS — M25562 Pain in left knee: Secondary | ICD-10-CM | POA: Diagnosis not present

## 2015-05-18 DIAGNOSIS — M25562 Pain in left knee: Secondary | ICD-10-CM | POA: Diagnosis not present

## 2015-05-18 DIAGNOSIS — R2689 Other abnormalities of gait and mobility: Secondary | ICD-10-CM | POA: Diagnosis not present

## 2015-05-18 DIAGNOSIS — M25561 Pain in right knee: Secondary | ICD-10-CM | POA: Diagnosis not present

## 2015-05-18 DIAGNOSIS — M1711 Unilateral primary osteoarthritis, right knee: Secondary | ICD-10-CM | POA: Diagnosis not present

## 2015-05-18 DIAGNOSIS — M170 Bilateral primary osteoarthritis of knee: Secondary | ICD-10-CM | POA: Diagnosis not present

## 2015-05-25 DIAGNOSIS — M5430 Sciatica, unspecified side: Secondary | ICD-10-CM | POA: Diagnosis not present

## 2015-05-25 DIAGNOSIS — M25562 Pain in left knee: Secondary | ICD-10-CM | POA: Diagnosis not present

## 2015-05-25 DIAGNOSIS — R2689 Other abnormalities of gait and mobility: Secondary | ICD-10-CM | POA: Diagnosis not present

## 2015-05-25 DIAGNOSIS — M25561 Pain in right knee: Secondary | ICD-10-CM | POA: Diagnosis not present

## 2015-05-25 DIAGNOSIS — M1712 Unilateral primary osteoarthritis, left knee: Secondary | ICD-10-CM | POA: Diagnosis not present

## 2015-06-06 DIAGNOSIS — M5430 Sciatica, unspecified side: Secondary | ICD-10-CM | POA: Diagnosis not present

## 2015-06-06 DIAGNOSIS — M25561 Pain in right knee: Secondary | ICD-10-CM | POA: Diagnosis not present

## 2015-06-06 DIAGNOSIS — M170 Bilateral primary osteoarthritis of knee: Secondary | ICD-10-CM | POA: Diagnosis not present

## 2015-06-06 DIAGNOSIS — R2689 Other abnormalities of gait and mobility: Secondary | ICD-10-CM | POA: Diagnosis not present

## 2015-06-06 DIAGNOSIS — M1711 Unilateral primary osteoarthritis, right knee: Secondary | ICD-10-CM | POA: Diagnosis not present

## 2015-06-06 DIAGNOSIS — M25562 Pain in left knee: Secondary | ICD-10-CM | POA: Diagnosis not present

## 2015-06-10 DIAGNOSIS — M79651 Pain in right thigh: Secondary | ICD-10-CM | POA: Diagnosis not present

## 2015-06-10 DIAGNOSIS — M79652 Pain in left thigh: Secondary | ICD-10-CM | POA: Diagnosis not present

## 2015-06-15 DIAGNOSIS — M79652 Pain in left thigh: Secondary | ICD-10-CM | POA: Diagnosis not present

## 2015-06-15 DIAGNOSIS — M79651 Pain in right thigh: Secondary | ICD-10-CM | POA: Diagnosis not present

## 2015-06-21 DIAGNOSIS — M79652 Pain in left thigh: Secondary | ICD-10-CM | POA: Diagnosis not present

## 2015-06-21 DIAGNOSIS — M79651 Pain in right thigh: Secondary | ICD-10-CM | POA: Diagnosis not present

## 2015-06-24 DIAGNOSIS — M79652 Pain in left thigh: Secondary | ICD-10-CM | POA: Diagnosis not present

## 2015-06-24 DIAGNOSIS — M79651 Pain in right thigh: Secondary | ICD-10-CM | POA: Diagnosis not present

## 2015-06-28 DIAGNOSIS — M79651 Pain in right thigh: Secondary | ICD-10-CM | POA: Diagnosis not present

## 2015-06-28 DIAGNOSIS — M79652 Pain in left thigh: Secondary | ICD-10-CM | POA: Diagnosis not present

## 2016-01-03 DIAGNOSIS — Z23 Encounter for immunization: Secondary | ICD-10-CM | POA: Diagnosis not present

## 2016-05-17 ENCOUNTER — Telehealth: Payer: Self-pay

## 2016-05-17 MED ORDER — AMLODIPINE BESYLATE 5 MG PO TABS
5.0000 mg | ORAL_TABLET | Freq: Every day | ORAL | 0 refills | Status: DC
Start: 2016-05-17 — End: 2016-07-30

## 2016-05-17 NOTE — Telephone Encounter (Signed)
THIS MESSAGE IS FROM ASHLEY AT [facility]: PATIENT IS REQUESTING A REFILL ON AMLODIPINE 5 MG. HE TAKES 1 PILL DAILY. LAST REFILLED BY DR. Phillips OdorJEFFERY ANDERSON. BEST PHONE [phone] ([facility] [address] AND ASK FOR ASHLEY-THE PHARMACIST) MBC

## 2016-07-24 ENCOUNTER — Other Ambulatory Visit: Payer: Self-pay | Admitting: Family Medicine

## 2016-07-29 ENCOUNTER — Other Ambulatory Visit: Payer: Self-pay | Admitting: Family Medicine

## 2016-07-30 ENCOUNTER — Encounter: Payer: Self-pay | Admitting: Family Medicine

## 2016-07-30 ENCOUNTER — Ambulatory Visit (INDEPENDENT_AMBULATORY_CARE_PROVIDER_SITE_OTHER): Payer: Medicare Other | Admitting: Family Medicine

## 2016-07-30 VITALS — BP 123/79 | HR 81 | Temp 98.3°F | Resp 17 | Ht 71.0 in | Wt 172.0 lb

## 2016-07-30 DIAGNOSIS — I482 Chronic atrial fibrillation, unspecified: Secondary | ICD-10-CM | POA: Insufficient documentation

## 2016-07-30 DIAGNOSIS — I10 Essential (primary) hypertension: Secondary | ICD-10-CM

## 2016-07-30 DIAGNOSIS — I4891 Unspecified atrial fibrillation: Secondary | ICD-10-CM | POA: Insufficient documentation

## 2016-07-30 MED ORDER — AMLODIPINE BESYLATE 5 MG PO TABS: 5.0000 mg | ORAL_TABLET | Freq: Every day | ORAL | 3 refills | Status: DC

## 2016-07-30 NOTE — Patient Instructions (Addendum)
Continue taking the amlodipine 5 mg daily. I gave you enough refills for one year, but it is important that you come in before you're last prescription runs out. We really should not refill it after the one year is used up, so you need to come back in next year in at least April to get rechecked.  Continue to stay active and get exercise and eat a balanced diet  If at anytime you're having problems come back in sooner.  We are checking some basic labs on you today and I will let you know the results of them.    IF you received an x-ray today, you will receive an invoice from [facility]. Please contact [facility] at [phone] with questions or concerns regarding your invoice.   IF you received labwork today, you will receive an invoice from [place]. Please contact [facility] at [phone] with questions or concerns regarding your invoice.   Our billing staff will not be able to assist you with questions regarding bills from these companies.  You will be contacted with the lab results as soon as they are available. The fastest way to get your results is to activate your My Chart account. Instructions are located on the last page of this paperwork. If you have not heard from [place] regarding the results in 2 weeks, please contact this office.

## 2016-07-30 NOTE — Progress Notes (Signed)
Patient ID: Noah Waters, male    DOB: [birth year]  Age: 83 y.o. MRN: [id number]  Chief Complaint  Patient presents with  . Medication Refill    Amlodipine 5 mg/ 90 day supply    Subjective:   83 year old man who has a history of hypertension and is back for a refill of his blood pressure medicines which is been out of for several weeks. He says that's why his pressure is high today.  His wife died a year and a half ago and he stays pretty busy caring for the old house he lives in. He is able to be active. 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 He has had some irregularity of his heart for many years, and about 20 years ago had a heart catheterization and everything seemed to be okay. His only medicine is been his blood pressure pill.  Current allergies, medications, problem list, past/family and social histories reviewed.  Objective:  BP (!) 156/79 (BP Location: Right Arm, Patient Position: Sitting, Cuff Size: Normal)   Pulse 81   Temp 98.3 F (36.8 C) (Oral)   Resp 17   Ht  (1.803 m)   Wt 172 lb (78 kg)   SpO2 97%   BMI 23.99 kg/m   Foley alert and oriented. No carotid bruits. Chest is clear to auscultation. Heart irregularly irregular, controlled rate.  Assessment & Plan:   Assessment: 1. Essential hypertension, benign   2. Chronic atrial fibrillation (HCC)       Plan: We will not add an anticoagulant. We discussed that. He is content the way things are and is doing very well.  Orders Placed This Encounter  Procedures  . Comprehensive metabolic panel  . CBC    Meds ordered this encounter  Medications  . amLODipine (NORVASC) 5 MG tablet    Sig: Take 1 tablet (5 mg total) by mouth daily.    Dispense:  90 tablet    Refill:  3    Needs office visit for further refills and b/p check  Last visit 04/2015         Patient Instructions   Continue taking  the amlodipine 5 mg daily. I gave you enough refills for one year, but it is important that you come in before you're last prescription runs out. We really should not refill it after the one year is used up, so you need to come back in next year in at least April to get rechecked.  Continue to stay active and get exercise and eat a balanced diet  If at anytime you're having problems come back in sooner.  We are checking some basic labs on you today and I will let you know the results of them.    IF you received an x-ray today, you will receive an invoice from [facility]. Please contact [facility] at [phone] with questions or concerns regarding your invoice.   IF you received labwork today, you will receive an invoice from [place]. Please contact [facility] at [phone] with questions or concerns regarding your invoice.   Our billing staff will not be able to assist you with questions regarding bills from these companies.  You will be contacted with the lab results as soon as they are available. The fastest way to get your results is to activate your My Chart account. Instructions are located on the last page of  this paperwork. If you have not heard from [place] regarding the results in 2 weeks, please contact this office.        Return in about 1 year (around 07/30/2017).   Emanuell Morina, MD 07/30/2016

## 2016-07-31 LAB — COMPREHENSIVE METABOLIC PANEL
ALBUMIN: 4.6 g/dL (ref 3.5–4.7)
ALK PHOS: 86 IU/L (ref 39–117)
ALT: 10 IU/L (ref 0–44)
AST: 21 IU/L (ref 0–40)
Albumin/Globulin Ratio: 1.9 (ref 1.2–2.2)
BILIRUBIN TOTAL: 0.8 mg/dL (ref 0.0–1.2)
BUN / CREAT RATIO: 12 (ref 10–24)
BUN: 14 mg/dL (ref 8–27)
CO2: 25 mmol/L (ref 18–29)
CREATININE: 1.15 mg/dL (ref 0.76–1.27)
Calcium: 9.8 mg/dL (ref 8.6–10.2)
Chloride: 98 mmol/L (ref 96–106)
GFR calc non Af Amer: 56 mL/min/{1.73_m2} — ABNORMAL LOW (ref >59)
GFR, EST AFRICAN AMERICAN: 65 mL/min/{1.73_m2} (ref >59)
GLOBULIN, TOTAL: 2.4 g/dL (ref 1.5–4.5)
GLUCOSE: 108 mg/dL — AB (ref 65–99)
Potassium: 4.2 mmol/L (ref 3.5–5.2)
Sodium: 140 mmol/L (ref 134–144)
TOTAL PROTEIN: 7 g/dL (ref 6.0–8.5)

## 2016-07-31 LAB — CBC
HEMATOCRIT: 45.7 % (ref 37.5–51.0)
Hemoglobin: 15.2 g/dL (ref 13.0–17.7)
MCH: 29.4 pg (ref 26.6–33.0)
MCHC: 33.3 g/dL (ref 31.5–35.7)
MCV: 88 fL (ref 79–97)
PLATELETS: 158 10*3/uL (ref 150–379)
RBC: 5.17 x10E6/uL (ref 4.14–5.80)
RDW: 14.8 % (ref 12.3–15.4)
WBC: 3.7 10*3/uL (ref 3.4–10.8)

## 2016-08-10 ENCOUNTER — Telehealth: Payer: Self-pay | Admitting: General Practice

## 2016-08-10 NOTE — Telephone Encounter (Signed)
Pt is looking for his lab results   Best number [phone]

## 2016-08-10 NOTE — Telephone Encounter (Signed)
Advised of nl labs

## 2017-01-18 DIAGNOSIS — Z23 Encounter for immunization: Secondary | ICD-10-CM | POA: Diagnosis not present

## 2017-07-23 ENCOUNTER — Other Ambulatory Visit: Payer: Self-pay | Admitting: Family Medicine

## 2017-08-19 ENCOUNTER — Encounter: Payer: Self-pay | Admitting: Urgent Care

## 2017-08-19 ENCOUNTER — Ambulatory Visit (INDEPENDENT_AMBULATORY_CARE_PROVIDER_SITE_OTHER): Payer: Medicare Other | Admitting: Urgent Care

## 2017-08-19 VITALS — BP 120/70 | HR 55 | Temp 98.2°F | Resp 18 | Ht 71.0 in | Wt 169.8 lb

## 2017-08-19 DIAGNOSIS — I10 Essential (primary) hypertension: Secondary | ICD-10-CM

## 2017-08-19 LAB — LIPID PANEL
CHOLESTEROL TOTAL: 128 mg/dL (ref 100–199)
Chol/HDL Ratio: 2.5 ratio (ref 0.0–5.0)
HDL: 51 mg/dL (ref >39)
LDL Calculated: 69 mg/dL (ref 0–99)
TRIGLYCERIDES: 38 mg/dL (ref 0–149)
VLDL Cholesterol Cal: 8 mg/dL (ref 5–40)

## 2017-08-19 LAB — COMPREHENSIVE METABOLIC PANEL
ALK PHOS: 74 IU/L (ref 39–117)
ALT: 10 IU/L (ref 0–44)
AST: 23 IU/L (ref 0–40)
Albumin/Globulin Ratio: 2 (ref 1.2–2.2)
Albumin: 4.6 g/dL (ref 3.2–4.6)
BUN/Creatinine Ratio: 10 (ref 10–24)
BUN: 11 mg/dL (ref 10–36)
Bilirubin Total: 1.1 mg/dL (ref 0.0–1.2)
CO2: 25 mmol/L (ref 20–29)
CREATININE: 1.1 mg/dL (ref 0.76–1.27)
Calcium: 9.8 mg/dL (ref 8.6–10.2)
Chloride: 102 mmol/L (ref 96–106)
GFR calc Af Amer: 68 mL/min/{1.73_m2} (ref >59)
GFR calc non Af Amer: 59 mL/min/{1.73_m2} — ABNORMAL LOW (ref >59)
GLOBULIN, TOTAL: 2.3 g/dL (ref 1.5–4.5)
Glucose: 89 mg/dL (ref 65–99)
POTASSIUM: 4.1 mmol/L (ref 3.5–5.2)
SODIUM: 140 mmol/L (ref 134–144)
Total Protein: 6.9 g/dL (ref 6.0–8.5)

## 2017-08-19 MED ORDER — AMLODIPINE BESYLATE 2.5 MG PO TABS: 2.5000 mg | ORAL_TABLET | Freq: Every day | ORAL | 1 refills | Status: DC

## 2017-08-19 NOTE — Patient Instructions (Addendum)
Check your blood pressure at home once weekly. I would like for your blood pressure to be between 120-140's. If it falls outside of these ranges consistently (for instance, 2-4 weeks, back to back) then I need you to come back for a recheck.      Hypertension Hypertension, commonly called high blood pressure, is when the force of blood pumping through the arteries is too strong. The arteries are the blood vessels that carry blood from the heart throughout the body. Hypertension forces the heart to work harder to pump blood and may cause arteries to become narrow or stiff. Having untreated or uncontrolled hypertension can cause heart attacks, strokes, kidney disease, and other problems. A blood pressure reading consists of a higher number over a lower number. Ideally, your blood pressure should be below 120/80. The first ("top") number is called the systolic pressure. It is a measure of the pressure in your arteries as your heart beats. The second ("bottom") number is called the diastolic pressure. It is a measure of the pressure in your arteries as the heart relaxes. What are the causes? The cause of this condition is not known. What increases the risk? Some risk factors for high blood pressure are under your control. Others are not. Factors you can change  Smoking.  Having type 2 diabetes mellitus, high cholesterol, or both.  Not getting enough exercise or physical activity.  Being overweight.  Having too much fat, sugar, calories, or salt (sodium) in your diet.  Drinking too much alcohol. Factors that are difficult or impossible to change  Having chronic kidney disease.  Having a family history of high blood pressure.  Age. Risk increases with age.  Race. You may be at higher risk if you are African-American.  Gender. Men are at higher risk than women before age 9. After age 67, women are at higher risk than men.  Having obstructive sleep apnea.  Stress. What are the signs  or symptoms? Extremely high blood pressure (hypertensive crisis) may cause:  Headache.  Anxiety.  Shortness of breath.  Nosebleed.  Nausea and vomiting.  Severe chest pain.  Jerky movements you cannot control (seizures).  How is this diagnosed? This condition is diagnosed by measuring your blood pressure while you are seated, with your arm resting on a surface. The cuff of the blood pressure monitor will be placed directly against the skin of your upper arm at the level of your heart. It should be measured at least twice using the same arm. Certain conditions can cause a difference in blood pressure between your right and left arms. Certain factors can cause blood pressure readings to be lower or higher than normal (elevated) for a short period of time:  When your blood pressure is higher when you are in a health care provider's office than when you are at home, this is called white coat hypertension. Most people with this condition do not need medicines.  When your blood pressure is higher at home than when you are in a health care provider's office, this is called masked hypertension. Most people with this condition may need medicines to control blood pressure.  If you have a high blood pressure reading during one visit or you have normal blood pressure with other risk factors:  You may be asked to return on a different day to have your blood pressure checked again.  You may be asked to monitor your blood pressure at home for 1 week or longer.  If you are diagnosed  with hypertension, you may have other blood or imaging tests to help your health care provider understand your overall risk for other conditions. How is this treated? This condition is treated by making healthy lifestyle changes, such as eating healthy foods, exercising more, and reducing your alcohol intake. Your health care provider may prescribe medicine if lifestyle changes are not enough to get your blood pressure  under control, and if:  Your systolic blood pressure is above 130.  Your diastolic blood pressure is above 80.  Your personal target blood pressure may vary depending on your medical conditions, your age, and other factors. Follow these instructions at home: Eating and drinking  Eat a diet that is high in fiber and potassium, and low in sodium, added sugar, and fat. An example eating plan is called the DASH (Dietary Approaches to Stop Hypertension) diet. To eat this way: ? Eat plenty of fresh fruits and vegetables. Try to fill half of your plate at each meal with fruits and vegetables. ? Eat whole grains, such as whole wheat pasta, brown rice, or whole grain bread. Fill about one quarter of your plate with whole grains. ? Eat or drink low-fat dairy products, such as skim milk or low-fat yogurt. ? Avoid fatty cuts of meat, processed or cured meats, and poultry with skin. Fill about one quarter of your plate with lean proteins, such as fish, chicken without skin, beans, eggs, and tofu. ? Avoid premade and processed foods. These tend to be higher in sodium, added sugar, and fat.  Reduce your daily sodium intake. Most people with hypertension should eat less than 1,500 mg of sodium a day.  Limit alcohol intake to no more than 1 drink a day for nonpregnant women and 2 drinks a day for men. One drink equals 12 oz of beer, 5 oz of wine, or 1 oz of hard liquor. Lifestyle  Work with your health care provider to maintain a healthy body weight or to lose weight. Ask what an ideal weight is for you.  Get at least 30 minutes of exercise that causes your heart to beat faster (aerobic exercise) most days of the week. Activities may include walking, swimming, or biking.  Include exercise to strengthen your muscles (resistance exercise), such as pilates or lifting weights, as part of your weekly exercise routine. Try to do these types of exercises for 30 minutes at least 3 days a week.  Do not use any  products that contain nicotine or tobacco, such as cigarettes and e-cigarettes. If you need help quitting, ask your health care provider.  Monitor your blood pressure at home as told by your health care provider.  Keep all follow-up visits as told by your health care provider. This is important. Medicines  Take over-the-counter and prescription medicines only as told by your health care provider. Follow directions carefully. Blood pressure medicines must be taken as prescribed.  Do not skip doses of blood pressure medicine. Doing this puts you at risk for problems and can make the medicine less effective.  Ask your health care provider about side effects or reactions to medicines that you should watch for. Contact a health care provider if:  You think you are having a reaction to a medicine you are taking.  You have headaches that keep coming back (recurring).  You feel dizzy.  You have swelling in your ankles.  You have trouble with your vision. Get help right away if:  You develop a severe headache or confusion.  You  have unusual weakness or numbness.  You feel faint.  You have severe pain in your chest or abdomen.  You vomit repeatedly.  You have trouble breathing. Summary  Hypertension is when the force of blood pumping through your arteries is too strong. If this condition is not controlled, it may put you at risk for serious complications.  Your personal target blood pressure may vary depending on your medical conditions, your age, and other factors. For most people, a normal blood pressure is less than 120/80.  Hypertension is treated with lifestyle changes, medicines, or a combination of both. Lifestyle changes include weight loss, eating a healthy, low-sodium diet, exercising more, and limiting alcohol. This information is not intended to replace advice given to you by your health care provider. Make sure you discuss any questions you have with your health care  provider. Document Released: 03/19/2005 Document Revised: 02/15/2016 Document Reviewed: 02/15/2016 Elsevier Interactive Patient Education  2018 [employer].    IF you received an x-ray today, you will receive an invoice from [facility]. Please contact [facility] at [phone] with questions or concerns regarding your invoice.   IF you received labwork today, you will receive an invoice from [place]. Please contact [facility] at [phone] with questions or concerns regarding your invoice.   Our billing staff will not be able to assist you with questions regarding bills from these companies.  You will be contacted with the lab results as soon as they are available. The fastest way to get your results is to activate your My Chart account. Instructions are located on the last page of this paperwork. If you have not heard from [place] regarding the results in 2 weeks, please contact this office.

## 2017-08-19 NOTE — Progress Notes (Signed)
    MRN: [id number] DOB: [birth year]  Subjective:   Noah Waters is a 84 y.o. male presenting for follow up on Hypertension. Currently managed with amlodipine  for the past 10 years. Patient is checking blood pressure at home, generally 120's systolic. Avoids salt in diet.  Patient's main concern today is whether he needs to be on this blood pressure medication at all. Denies lightheadedness, dizziness, chronic headache, chest pain, shortness of breath, heart racing, palpitations, nausea, vomiting, abdominal pain, hematuria, lower leg swelling. Denies smoking cigarettes.   Jevonte has a current medication list which includes the following prescription(s): amlodipine. Also has No Known Allergies.  Sartaj  has a past medical history of Hypertension. Also  has a past surgical history that includes Eye surgery.  Objective:   Vitals: BP 120/70   Pulse (!) 55   Temp 98.2 F (36.8 C) (Oral)   Resp 18   Ht  (1.803 m)   Wt 169 lb 12.8 oz (77 kg)   SpO2 98%   BMI 23.68 kg/m   BP Readings from Last 3 Encounters:  08/19/17 120/70  07/30/16 123/79  04/21/15 124/62    Physical Exam  Constitutional: He is oriented to person, place, and time. He appears well-developed and well-nourished.  HENT:  Mouth/Throat: Oropharynx is clear and moist.  Eyes: Pupils are equal, round, and reactive to light. EOM are normal. No scleral icterus.  Cardiovascular: Normal rate, regular rhythm and intact distal pulses. Exam reveals no gallop and no friction rub.  No murmur heard. Pulmonary/Chest: No respiratory distress. He has no wheezes. He has no rales.  Musculoskeletal: He exhibits no edema.  Neurological: He is alert and oriented to person, place, and time.  Skin: Skin is warm and dry.  Psychiatric: He has a normal mood and affect.   Assessment and Plan :   Essential hypertension, benign - Plan: Comprehensive metabolic panel, Lipid panel  Labs pending.  Patient is doing very well, is very pleasant.   We discussed his blood pressure medications and agreed to use 2.5 mg of amlodipine to see how he does.  Patient will continue to check his blood pressure at home.  Parameters given for return to clinic visit.  Otherwise follow-up in 6 months.  Wallis Bamberg, PA-C Primary Care at [facility] [phone] 08/19/2017  10:18 AM

## 2017-08-27 ENCOUNTER — Encounter: Payer: Self-pay | Admitting: *Deleted

## 2017-08-28 ENCOUNTER — Encounter: Payer: Self-pay | Admitting: Family Medicine

## 2017-09-09 ENCOUNTER — Telehealth: Payer: Self-pay | Admitting: Urgent Care

## 2017-09-09 NOTE — Telephone Encounter (Signed)
Copied from CRM [id number]. Topic: General - Other >> Sep 09, 2017 10:12 AM Percival SpanishKennedy, Cheryl W wrote:  Pt call to say he would like to go back to 5mg  of the below med  amLODipine (NORVASC) 2.5 MG tablet  [facility] [address]  >> Sep 09, 2017  4:38 PM Zada GirtLander, Lumin L wrote:   Patient wants script changed Amlodipine 5mg  and not 2.[id number] [place] [place] - [address] [facility] [address]  [address] [place] [id number]  Phone: [phone] Fax: [phone]

## 2017-09-10 MED ORDER — AMLODIPINE BESYLATE 5 MG PO TABS: 5.0000 mg | ORAL_TABLET | Freq: Every day | ORAL | 1 refills | Status: DC

## 2017-09-10 NOTE — Addendum Note (Signed)
Addended by: Wallis BambergMANI, Braya Habermehl on: 09/10/2017 09:37 PM   Modules accepted: Orders

## 2017-09-10 NOTE — Telephone Encounter (Signed)
This is okay. We reviewed parameters for increasing back to his normal dose of amlodipine. Please ask him why he wants to go back to his normal dose just to make sure something else isn't going on warranting an office visit.

## 2017-09-11 NOTE — Telephone Encounter (Signed)
Voice mailbox has not been set up - unable to leave a message.

## 2017-12-05 ENCOUNTER — Telehealth: Payer: Self-pay | Admitting: Urgent Care

## 2017-12-05 NOTE — Telephone Encounter (Signed)
Called to try and reschedule appt that is scheduled with [facility] for 02/18/18. Home # is disconnected and mobile # has no VM set up. If pt calls back, please reschedule him with another provider around 02/18/18 for:   Return in about 6 months (around 02/19/2018) for hypertension.  Thank you!

## 2017-12-27 DIAGNOSIS — Z23 Encounter for immunization: Secondary | ICD-10-CM | POA: Diagnosis not present

## 2018-02-11 ENCOUNTER — Other Ambulatory Visit: Payer: Self-pay | Admitting: Urgent Care

## 2018-02-18 ENCOUNTER — Ambulatory Visit: Payer: Medicare Other | Admitting: Urgent Care

## 2018-05-14 ENCOUNTER — Telehealth: Payer: Self-pay | Admitting: *Deleted

## 2018-05-14 NOTE — Telephone Encounter (Signed)
Left message to schedule AWV 

## 2018-06-01 ENCOUNTER — Other Ambulatory Visit: Payer: Self-pay | Admitting: Family Medicine

## 2018-06-02 NOTE — Telephone Encounter (Signed)
Requested Prescriptions  Pending Prescriptions Disp Refills  . amLODipine (NORVASC) 2.5 MG tablet [Pharmacy Med Name: AMLODIPINE BESYLATE 2.5 MG TAB] 31 tablet 0    Sig: TAKE 1 TABLET BY MOUTH EVERY DAY     Cardiovascular:  Calcium Channel Blockers Failed - 06/01/2018 12:09 AM      Failed - Valid encounter within last 6 months    Recent Outpatient Visits          9 months ago Essential hypertension, benign   Primary Care at [facility], [place], [place]   1 year ago Essential hypertension, benign   Primary Care at [facility], Sandria Bales, MD   3 years ago Essential hypertension, benign   [facility] at [facility], Tessa Lerner, [place]   3 years ago Essential hypertension, benign   Primary Care at [facility], Harrel Lemon, MD   4 years ago Essential hypertension, benign   [facility] at [facility], Levell July, MD      Future Appointments            In 1 month Sagardia, Eilleen Kempf, MD Primary Care at [place], [facility]           Passed - Last BP in normal range    BP Readings from Last 1 Encounters:  08/19/17 120/70       Contacted pt. and scheduled an appt. 07/02/18, with a new provider; gave a 31 day courtesy refill to get pt. To that appt.  Pt. Verb. Understanding.

## 2018-06-28 ENCOUNTER — Other Ambulatory Visit: Payer: Self-pay | Admitting: Family Medicine

## 2018-07-02 ENCOUNTER — Other Ambulatory Visit: Payer: Self-pay

## 2018-07-02 ENCOUNTER — Ambulatory Visit: Payer: Medicare Other | Admitting: Emergency Medicine

## 2018-07-31 ENCOUNTER — Other Ambulatory Visit: Payer: Self-pay | Admitting: Emergency Medicine

## 2018-07-31 NOTE — Telephone Encounter (Signed)
Requested medication (s) are due for refill today -yes  Requested medication (s) are on the active medication list -yes  Future visit scheduled -no  Last refill: 1 month ago  Notes to clinic: Patient is requesting RF of medication that requires appointment. Sent for review of RF request- home number disconnected  Requested Prescriptions  Pending Prescriptions Disp Refills   amLODipine (NORVASC) 2.5 MG tablet [Pharmacy Med Name: AMLODIPINE BESYLATE 2.5 MG TAB] 31 tablet 0    Sig: TAKE 1 TABLET BY MOUTH EVERY DAY     Cardiovascular:  Calcium Channel Blockers Failed - 07/31/2018  1:31 PM      Failed - Valid encounter within last 6 months    Recent Outpatient Visits          11 months ago Essential hypertension, benign   Primary Care at [facility], [place], [place]   2 years ago Essential hypertension, benign   Primary Care at [facility], Sandria Bales, MD   3 years ago Essential hypertension, benign   [facility] at [facility], Tessa Lerner, MD   4 years ago Essential hypertension, benign   Primary Care at [facility], Harrel Lemon, MD   4 years ago Essential hypertension, benign   [facility] at [facility], Levell July, MD             Passed - Last BP in normal range    BP Readings from Last 1 Encounters:  08/19/17 120/70          Requested Prescriptions  Pending Prescriptions Disp Refills   amLODipine (NORVASC) 2.5 MG tablet [Pharmacy Med Name: AMLODIPINE BESYLATE 2.5 MG TAB] 31 tablet 0    Sig: TAKE 1 TABLET BY MOUTH EVERY DAY     Cardiovascular:  Calcium Channel Blockers Failed - 07/31/2018  1:31 PM      Failed - Valid encounter within last 6 months    Recent Outpatient Visits          11 months ago Essential hypertension, benign   Primary Care at [facility], [place], PA-C   2 years ago Essential hypertension, benign   Primary Care at [facility], Sandria Bales, MD   3 years ago Essential hypertension, benign   [facility] at [facility], Tessa Lerner, MD   4 years ago  Essential hypertension, benign   Primary Care at [facility], Harrel Lemon, MD   4 years ago Essential hypertension, benign   [facility] Care at [facility], Levell July, MD             Passed - Last BP in normal range    BP Readings from Last 1 Encounters:  08/19/17 120/70

## 2018-08-30 ENCOUNTER — Other Ambulatory Visit: Payer: Self-pay | Admitting: Emergency Medicine

## 2018-10-17 ENCOUNTER — Other Ambulatory Visit: Payer: Self-pay | Admitting: Emergency Medicine

## 2018-10-17 NOTE — Telephone Encounter (Signed)
Requested medications are due today for refill :     yes   Requested medications are on active medication list    Yes   Last refill  06/28/18  Future visit scheduled  No   Notes to clinic  Called patient to schedule an appointment for further refills. He was unable to understand that he needs to be seen for an appointment. Attempted to call daughter and son and numbers are incorrect. [facility] that [facility] needs to be see for more refills. Last OV was 08/19/17.    Requested Prescriptions  Pending Prescriptions Disp Refills   amLODipine (NORVASC) 2.5 MG tablet [Pharmacy Med Name: AMLODIPINE BESYLATE 2.5 MG TAB] 31 tablet 0    Sig: TAKE 1 TABLET BY MOUTH EVERY DAY     Cardiovascular:  Calcium Channel Blockers Failed - 10/17/2018 10:38 AM      Failed - Valid encounter within last 6 months    Recent Outpatient Visits          1 year ago Essential hypertension, benign   Primary Care at [place], [place]   2 years ago Essential hypertension, benign   Primary Care at [facility], Fenton Malling, MD   3 years ago Essential hypertension, benign   [facility] at [facility], Janalee Dane, MD   4 years ago Essential hypertension, benign   Primary Care at [facility], Linton Ham, MD   4 years ago Essential hypertension, benign   Primary Care at [facility], Laurey Arrow, MD             Passed - Last BP in normal range    BP Readings from Last 1 Encounters:  08/19/17 120/70

## 2018-10-23 ENCOUNTER — Telehealth: Payer: Self-pay | Admitting: *Deleted

## 2018-10-23 ENCOUNTER — Telehealth: Payer: Medicare Other | Admitting: Emergency Medicine

## 2018-10-23 ENCOUNTER — Encounter: Payer: Self-pay | Admitting: Emergency Medicine

## 2018-10-23 ENCOUNTER — Other Ambulatory Visit: Payer: Self-pay

## 2018-10-23 NOTE — Telephone Encounter (Signed)
Called patient to triage for appointment. Left message in mobile voice mail to call back, this is my 3rd time contacting the patient.

## 2018-10-23 NOTE — Telephone Encounter (Signed)
Called patient to triage for appointment at the mobile and home number. No answer at either number, I will call patient again before appointment time.

## 2018-10-24 ENCOUNTER — Encounter: Payer: Self-pay | Admitting: Emergency Medicine

## 2019-01-06 DIAGNOSIS — Z23 Encounter for immunization: Secondary | ICD-10-CM | POA: Diagnosis not present

## 2019-04-06 ENCOUNTER — Other Ambulatory Visit: Payer: Self-pay | Admitting: Emergency Medicine

## 2019-04-06 ENCOUNTER — Telehealth: Payer: Self-pay | Admitting: *Deleted

## 2019-04-06 NOTE — Telephone Encounter (Signed)
Lvm on home phone / cell did not work

## 2019-04-06 NOTE — Telephone Encounter (Signed)
Prescription sent  Please schedule appointment 

## 2019-04-06 NOTE — Telephone Encounter (Signed)
Requested medication (s) are due for refill today: yes  Requested medication (s) are on the active medication list:  yes  Last refill:  01/11/2019  Future visit scheduled: no  Notes to clinic:     No valid encounter within last 6 months Review for refill     Requested Prescriptions  Pending Prescriptions Disp Refills   amLODipine (NORVASC) 2.5 MG tablet [Pharmacy Med Name: AMLODIPINE BESYLATE 2.5 MG TAB] 90 tablet 1    Sig: TAKE 1 TABLET BY MOUTH EVERY DAY      Cardiovascular:  Calcium Channel Blockers Failed - 04/06/2019 12:11 AM      Failed - Valid encounter within last 6 months    Recent Outpatient Visits           1 year ago Essential hypertension, benign   Primary Care at [facility], [place], [place]   2 years ago Essential hypertension, benign   Primary Care at [facility], Sandria Bales, MD   3 years ago Essential hypertension, benign   [facility] at [facility], Tessa Lerner, MD   4 years ago Essential hypertension, benign   Primary Care at [facility], Harrel Lemon, MD   5 years ago Essential hypertension, benign   [facility] at [facility], Levell July, MD              Passed - Last BP in normal range    BP Readings from Last 1 Encounters:  08/19/17 120/70

## 2019-05-12 ENCOUNTER — Other Ambulatory Visit: Payer: Self-pay | Admitting: Emergency Medicine

## 2019-05-12 NOTE — Telephone Encounter (Signed)
Requested medication (s) are due for refill today: yes  Requested medication (s) are on the active medication list: yes  Last refill:  04/06/19  Future visit scheduled: no  Notes to clinic:  no valid encounter within last 6 months   Requested Prescriptions  Pending Prescriptions Disp Refills   amLODipine (NORVASC) 2.5 MG tablet [Pharmacy Med Name: AMLODIPINE BESYLATE 2.5 MG TAB] 30 tablet 0    Sig: TAKE 1 TABLET BY MOUTH EVERY DAY      Cardiovascular:  Calcium Channel Blockers Failed - 05/12/2019  1:32 PM      Failed - Valid encounter within last 6 months    Recent Outpatient Visits           1 year ago Essential hypertension, benign   Primary Care at [facility], [place], [place]   2 years ago Essential hypertension, benign   Primary Care at [facility], Sandria Bales, MD   4 years ago Essential hypertension, benign   [facility] at [facility], Tessa Lerner, MD   4 years ago Essential hypertension, benign   Primary Care at [facility], Harrel Lemon, MD   5 years ago Essential hypertension, benign   [facility] Care at [facility], Levell July, MD              Passed - Last BP in normal range    BP Readings from Last 1 Encounters:  08/19/17 120/70

## 2019-05-12 NOTE — Telephone Encounter (Signed)
Please schedule appt for med refill

## 2019-05-12 NOTE — Telephone Encounter (Signed)
Called pt. No VM available.  

## 2019-05-26 ENCOUNTER — Telehealth (INDEPENDENT_AMBULATORY_CARE_PROVIDER_SITE_OTHER): Payer: Medicare Other | Admitting: Emergency Medicine

## 2019-05-26 ENCOUNTER — Other Ambulatory Visit: Payer: Self-pay

## 2019-05-26 ENCOUNTER — Encounter: Payer: Self-pay | Admitting: Emergency Medicine

## 2019-05-26 VITALS — BP 127/62 | Ht 74.0 in | Wt 183.0 lb

## 2019-05-26 DIAGNOSIS — I10 Essential (primary) hypertension: Secondary | ICD-10-CM

## 2019-05-26 MED ORDER — AMLODIPINE BESYLATE 2.5 MG PO TABS: 2.5000 mg | ORAL_TABLET | Freq: Every day | ORAL | 1 refills | Status: DC

## 2019-05-26 NOTE — Progress Notes (Signed)
Telemedicine Encounter- SOAP NOTE Established Patient  This telephone encounter was conducted with the patient's (or proxy's) verbal consent via audio telecommunications: yes/no: Yes Patient was instructed to have this encounter in a suitably private space; and to only have persons present to whom they give permission to participate. In addition, patient identity was confirmed by use of name plus two identifiers (DOB and address).  I discussed the limitations, risks, security and privacy concerns of performing an evaluation and management service by telephone and the availability of in person appointments. I also discussed with the patient that there may be a patient responsible charge related to this service. The patient expressed understanding and agreed to proceed.  I spent a total of TIME; 0 MIN TO 60 MIN: 15 minutes talking with the patient or their proxy.  Chief Complaint  Patient presents with  . Medication Refill    Amlodipine - per patient he has two pills left    Subjective   Noah Waters is a 86 y.o. male established patient.  Has not been seen by me yet in the office.  First telephone visit today for medication refill.  Patient has history of hypertension, on amlodipine 2.5 mg daily.  Daughter also participating in the conversation. Has no complaints or medical concerns today.  Blood pressure readings normal at home.  HPI   Patient Active Problem List   Diagnosis Date Noted  . Atrial fibrillation ([place]) 07/30/2016  . Essential hypertension, benign 11/28/2012    Past Medical History:  Diagnosis Date  . Hypertension     Current Outpatient Medications  Medication Sig Dispense Refill  . amLODipine (NORVASC) 2.5 MG tablet Take 1 tablet (2.5 mg total) by mouth daily. 90 tablet 1   No current facility-administered medications for this visit.    No Known Allergies  Social History   Socioeconomic History  . Marital status: Widowed    Spouse name: Not on file  .  Number of children: Not on file  . Years of education: Not on file  . Highest education level: Not on file  Occupational History  . Not on file  Tobacco Use  . Smoking status: Former Smoker    Quit date: 04/12/1948    Years since quitting: 71.1  . Smokeless tobacco: Never Used  Substance and Sexual Activity  . Alcohol use: No  . Drug use: No  . Sexual activity: Not on file  Other Topics Concern  . Not on file  Social History Narrative  . Not on file   Social Determinants of Health   Financial Resource Strain:   . Difficulty of Paying Living Expenses: Not on file  Food Insecurity:   . Worried About Charity fundraiser in the Last Year: Not on file  . Ran Out of Food in the Last Year: Not on file  Transportation Needs:   . Lack of Transportation (Medical): Not on file  . Lack of Transportation (Non-Medical): Not on file  Physical Activity:   . Days of Exercise per Week: Not on file  . Minutes of Exercise per Session: Not on file  Stress:   . Feeling of Stress : Not on file  Social Connections:   . Frequency of Communication with Friends and Family: Not on file  . Frequency of Social Gatherings with Friends and Family: Not on file  . Attends Religious Services: Not on file  . Active Member of Clubs or Organizations: Not on file  . Attends Archivist  Meetings: Not on file  . Marital Status: Not on file  Intimate Partner Violence:   . Fear of Current or Ex-Partner: Not on file  . Emotionally Abused: Not on file  . Physically Abused: Not on file  . Sexually Abused: Not on file    Review of Systems  Constitutional: Negative.  Negative for chills and fever.  HENT: Negative.  Negative for congestion and sore throat.   Respiratory: Negative.  Negative for cough and shortness of breath.   Cardiovascular: Negative.  Negative for palpitations.  Gastrointestinal: Negative.  Negative for abdominal pain, blood in stool, melena, nausea and vomiting.  Musculoskeletal:  Negative.  Negative for myalgias and neck pain.  Skin: Negative.  Negative for rash.  Neurological: Negative.  Negative for dizziness and headaches.  All other systems reviewed and are negative.   Objective  Alert and oriented x3 in no apparent respiratory distress. Vitals as reported by the patient: Today's Vitals   05/26/19 1629  BP: 127/62  Weight: 183 lb (83 kg)  Height: 6\' 2"  (1.88 m)    Fuad was seen today for medication refill.  Diagnoses and all orders for this visit:  Essential hypertension, benign -     amLODipine (NORVASC) 2.5 MG tablet; Take 1 tablet (2.5 mg total) by mouth daily.  Clinically stable.  No medical concerns identified during this visit.  Continue present medication. Office visit in the next 3 to 6 months.   I discussed the assessment and treatment plan with the patient. The patient was provided an opportunity to ask questions and all were answered. The patient agreed with the plan and demonstrated an understanding of the instructions.   The patient was advised to call back or seek an in-person evaluation if the symptoms worsen or if the condition fails to improve as anticipated.  I provided 15 minutes of non-face-to-face time during this encounter.  Dorene Sorrow, MD  Primary Care at [facility]

## 2019-08-11 ENCOUNTER — Ambulatory Visit: Payer: Medicare Other | Admitting: Emergency Medicine

## 2019-08-12 ENCOUNTER — Encounter: Payer: Self-pay | Admitting: Emergency Medicine

## 2019-08-18 ENCOUNTER — Encounter (HOSPITAL_COMMUNITY): Payer: Self-pay

## 2019-08-18 ENCOUNTER — Ambulatory Visit (HOSPITAL_COMMUNITY)
Admission: EM | Admit: 2019-08-18 | Discharge: 2019-08-18 | Disposition: A | Discharge status: Home / Self Care | Payer: Medicare Other | Attending: Physician Assistant | Admitting: Physician Assistant

## 2019-08-18 ENCOUNTER — Other Ambulatory Visit: Payer: Self-pay

## 2019-08-18 DIAGNOSIS — B353 Tinea pedis: Secondary | ICD-10-CM

## 2019-08-18 MED ORDER — MICONAZOLE NITRATE 2 % EX POWD: CUTANEOUS | 0 refills | Status: DC | PRN

## 2019-08-18 NOTE — ED Provider Notes (Signed)
[facility]    CSN: [id number] Arrival date & time: 08/18/19  1339      History   Chief Complaint Chief Complaint  Patient presents with  . Foot Swelling    HPI CHAMAR BROUGHTON is a 86 y.o. male.   Patient is brought in by daughter for evaluation of concerned about foot infection and foot swelling.  Patient notes 2 weeks ago he had noticed crusting between his toes on both feet.  That he had noticed some swelling in the right foot and a little bit in the left.  Is reported that patient son had noticed the right foot was fairly swollen about a week ago but this is since resolved mostly.  Patient denies any pain or itching around the lesions are in his foot.  He has been walking without issue.  There is been no fevers or chills.  He reports he wears socks and heavy shoes frequently.     Past Medical History:  Diagnosis Date  . Hypertension     Patient Active Problem List   Diagnosis Date Noted  . Atrial fibrillation ([place]) 07/30/2016  . Essential hypertension, benign 11/28/2012    Past Surgical History:  Procedure Laterality Date  . EYE SURGERY         Home Medications    Prior to Admission medications   Medication Sig Start Date End Date Taking? Authorizing Provider  amLODipine (NORVASC) 2.5 MG tablet Take 1 tablet (2.5 mg total) by mouth daily. 05/26/19 08/24/19  Horald Pollen, MD  miconazole (MICOTIN) 2 % powder Apply topically as needed for itching. 08/18/19   Islam Eichinger, Marguerita Beards, PA-C    Family History History reviewed. No pertinent family history.  Social History Social History   Tobacco Use  . Smoking status: Former Smoker    Quit date: 04/12/1948    Years since quitting: 71.3  . Smokeless tobacco: Never Used  Substance Use Topics  . Alcohol use: No  . Drug use: No     Allergies   Patient has no known allergies.   Review of Systems Review of Systems   Physical Exam Triage Vital Signs ED Triage Vitals  Enc Vitals Group     BP  08/18/19 1432 139/80     Pulse Rate 08/18/19 1432 77     Resp 08/18/19 1432 18     Temp 08/18/19 1432 98.7 F (37.1 C)     Temp Source 08/18/19 1432 Oral     SpO2 08/18/19 1432 99 %     Weight 08/18/19 1430 144 lb 12.8 oz (65.7 kg)     Height --      Head Circumference --      Peak Flow --      Pain Score 08/18/19 1429 0     Pain Loc --      Pain Edu? --      Excl. in [place]? --    No data found.  Updated Vital Signs BP 139/80 (BP Location: Right Arm)   Pulse 77   Temp 98.7 F (37.1 C) (Oral)   Resp 18   Wt 144 lb 12.8 oz (65.7 kg)   SpO2 99%   BMI 18.59 kg/m   Visual Acuity Right Eye Distance:   Left Eye Distance:   Bilateral Distance:    Right Eye Near:   Left Eye Near:    Bilateral Near:     Physical Exam Vitals and nursing note reviewed.  Constitutional:  General: He is not in acute distress.    Appearance: He is well-developed. He is not ill-appearing.  HENT:     Head: Normocephalic and atraumatic.  Eyes:     Conjunctiva/sclera: Conjunctivae normal.  Cardiovascular:     Rate and Rhythm: Normal rate.     Heart sounds: No murmur.  Pulmonary:     Effort: Pulmonary effort is normal. No respiratory distress.  Musculoskeletal:     Cervical back: Neck supple.     Comments: Right foot with mild at best swelling and a slight deep red hue.  Nontender to palpation.  There are several crusting lesions in the webspace of the toes as pictured below they are nontender and nonweeping.  Left foot with similar mild deep red hue and crusting lesions present.  Pulses 2+ bilaterally dorsalis pedis.  Range of motion normal in the ankle.  Range of motion normal in the toes.  Skin:    General: Skin is warm and dry.  Neurological:     General: No focal deficit present.     Mental Status: He is alert and oriented to person, place, and time.            UC Treatments / Results  Labs (all labs ordered are listed, but only abnormal results are displayed) Labs  Reviewed - No data to display  EKG   Radiology No results found.  Procedures Procedures (including critical care time)  Medications Ordered in UC Medications - No data to display  Initial Impression / Assessment and Plan / UC Course  I have reviewed the triage vital signs and the nursing notes.  Pertinent labs & imaging results that were available during my care of the patient were reviewed by me and considered in my medical decision making (see chart for details).     #Tinea pedis Patient is a 86 year old presenting with tinea pedis.  Likely swelling and erythema is either secondary to response to tinea pedis or mild stasis dermatitis.  Doubt cellulitis.  Patient discussed and examined with supervising physician. -Placed on miconazole powder. -Patient to follow-up with primary care in 7 to 10 days for reevaluation -Strict return emergency department precautions were discussed. -Patient and daughter verbalized understanding Final Clinical Impressions(s) / UC Diagnoses   Final diagnoses:  Tinea pedis of both feet     Discharge Instructions     Apply the powder between the toes twice a day  Minimize the amount of time she wearing socks and shoes.  Wear open toe shoes while at the house.  You would keep your feet dry  Elevate your feet when resting  Schedule follow-up with your primary care in about a week to 10 days for reevaluation  If swelling becomes severe, you have severe pain in your feet or develop fever chills please return for reevaluation.      ED Prescriptions    Medication Sig Dispense Auth. Provider   miconazole (MICOTIN) 2 % powder Apply topically as needed for itching. 70 g Marcele Kosta, Veryl Speak, PA-C     PDMP not reviewed this encounter.   Hermelinda Medicus, PA-C 08/18/19 2052

## 2019-08-18 NOTE — Discharge Instructions (Addendum)
Apply the powder between the toes twice a day  Minimize the amount of time she wearing socks and shoes.  Wear open toe shoes while at the house.  You would keep your feet dry  Elevate your feet when resting  Schedule follow-up with your primary care in about a week to 10 days for reevaluation  If swelling becomes severe, you have severe pain in your feet or develop fever chills please return for reevaluation.

## 2019-08-18 NOTE — ED Triage Notes (Signed)
Pt daughter states the pt has a fungus on his right foot and between his toes. Pt states his foot was swollen.

## 2019-12-23 ENCOUNTER — Other Ambulatory Visit: Payer: Self-pay | Admitting: Emergency Medicine

## 2019-12-23 DIAGNOSIS — I10 Essential (primary) hypertension: Secondary | ICD-10-CM

## 2019-12-23 NOTE — Telephone Encounter (Signed)
Requested Prescriptions  Pending Prescriptions Disp Refills  . amLODipine (NORVASC) 2.5 MG tablet [Pharmacy Med Name: AMLODIPINE BESYLATE 2.5 MG TAB] 30 tablet 0    Sig: TAKE 1 TABLET BY MOUTH EVERY DAY     Cardiovascular:  Calcium Channel Blockers Failed - 12/23/2019 12:06 AM      Failed - Valid encounter within last 6 months    Recent Outpatient Visits          7 months ago Essential hypertension, benign   Primary Care at [facility], Eilleen Kempf, MD   2 years ago Essential hypertension, benign   Primary Care at [facility], [place], [place]   3 years ago Essential hypertension, benign   Primary Care at [facility], Sandria Bales, MD   4 years ago Essential hypertension, benign   Primary Care at [facility], Tessa Lerner, MD   5 years ago Essential hypertension, benign   Primary Care at [facility], Harrel Lemon, MD             Passed - Last BP in normal range    BP Readings from Last 1 Encounters:  08/18/19 139/80         One month supply courtesy refill with reminder for patient to call and schedule an appt for follow-up.

## 2020-01-13 ENCOUNTER — Other Ambulatory Visit: Payer: Self-pay | Admitting: Emergency Medicine

## 2020-01-13 DIAGNOSIS — I10 Essential (primary) hypertension: Secondary | ICD-10-CM

## 2020-03-15 ENCOUNTER — Other Ambulatory Visit: Payer: Self-pay | Admitting: Emergency Medicine

## 2020-03-15 DIAGNOSIS — I10 Essential (primary) hypertension: Secondary | ICD-10-CM

## 2020-03-15 NOTE — Telephone Encounter (Signed)
Requested medication (s) are due for refill today: no  Requested medication (s) are on the active medication list: yes  Last refill:  02/22/2020  Future visit scheduled: no  Notes to clinic:  overdue for a 6 month follow up    Requested Prescriptions  Pending Prescriptions Disp Refills   amLODipine (NORVASC) 2.5 MG tablet [Pharmacy Med Name: AMLODIPINE BESYLATE 2.5 MG TAB] 30 tablet 0    Sig: TAKE 1 TABLET BY MOUTH EVERY DAY      Cardiovascular:  Calcium Channel Blockers Failed - 03/15/2020  2:30 PM      Failed - Valid encounter within last 6 months    Recent Outpatient Visits           9 months ago Essential hypertension, benign   Primary Care at [facility], Eilleen Kempf, MD   2 years ago Essential hypertension, benign   Primary Care at [facility], [place], [place]   3 years ago Essential hypertension, benign   Primary Care at [facility], Sandria Bales, MD   4 years ago Essential hypertension, benign   Primary Care at [facility], Tessa Lerner, MD   5 years ago Essential hypertension, benign   Primary Care at [facility], Harrel Lemon, MD                Passed - Last BP in normal range    BP Readings from Last 1 Encounters:  08/18/19 139/80

## 2020-03-16 NOTE — Telephone Encounter (Signed)
Tried calling number that is priority on pts chart. Seems to be wrong number [phone]). LVMTCB on mobile phone so we could sch med refill appt.

## 2020-03-16 NOTE — Telephone Encounter (Signed)
Pt needs OV for refills. Refilled medication today, until OV.

## 2020-03-29 ENCOUNTER — Other Ambulatory Visit: Payer: Self-pay | Admitting: Emergency Medicine

## 2020-03-29 DIAGNOSIS — I10 Essential (primary) hypertension: Secondary | ICD-10-CM

## 2020-06-09 ENCOUNTER — Other Ambulatory Visit: Payer: Self-pay | Admitting: Emergency Medicine

## 2020-06-09 DIAGNOSIS — I10 Essential (primary) hypertension: Secondary | ICD-10-CM

## 2020-06-09 NOTE — Telephone Encounter (Signed)
Pt has appt 07/26/20. Courtesy refill until appt date.

## 2020-07-24 ENCOUNTER — Other Ambulatory Visit: Payer: Self-pay | Admitting: Emergency Medicine

## 2020-07-24 DIAGNOSIS — I10 Essential (primary) hypertension: Secondary | ICD-10-CM

## 2020-07-26 ENCOUNTER — Encounter: Payer: Medicare Other | Admitting: Emergency Medicine

## 2020-07-26 ENCOUNTER — Encounter: Payer: Self-pay | Admitting: Emergency Medicine

## 2020-09-02 ENCOUNTER — Other Ambulatory Visit: Payer: Self-pay | Admitting: Emergency Medicine

## 2020-09-02 DIAGNOSIS — I10 Essential (primary) hypertension: Secondary | ICD-10-CM

## 2020-09-16 ENCOUNTER — Other Ambulatory Visit: Payer: Self-pay | Admitting: Emergency Medicine

## 2020-09-16 DIAGNOSIS — I10 Essential (primary) hypertension: Secondary | ICD-10-CM

## 2020-09-21 ENCOUNTER — Ambulatory Visit: Payer: Medicare Other | Admitting: Family Medicine

## 2020-09-27 ENCOUNTER — Ambulatory Visit: Payer: Medicare Other | Admitting: Family Medicine

## 2020-09-28 ENCOUNTER — Other Ambulatory Visit: Payer: Self-pay

## 2020-09-28 ENCOUNTER — Encounter: Payer: Self-pay | Admitting: Family Medicine

## 2020-09-28 ENCOUNTER — Ambulatory Visit (INDEPENDENT_AMBULATORY_CARE_PROVIDER_SITE_OTHER): Payer: Medicare Other | Admitting: Family Medicine

## 2020-09-28 VITALS — BP 126/64 | HR 65 | Temp 97.1°F | Ht 74.0 in | Wt 151.0 lb

## 2020-09-28 DIAGNOSIS — I4891 Unspecified atrial fibrillation: Secondary | ICD-10-CM

## 2020-09-28 DIAGNOSIS — I10 Essential (primary) hypertension: Secondary | ICD-10-CM | POA: Diagnosis not present

## 2020-09-28 NOTE — Patient Instructions (Addendum)
Keep check on BP Should be less than 140/90 Call if BP is higher Check heart rate; if greater than 100,  usually call me

## 2020-09-28 NOTE — Progress Notes (Signed)
Provider:  Jacalyn Lefevre, MD  Careteam: Patient Care Team: Georgina Quint, MD as PCP - General (Internal Medicine)  PLACE OF SERVICE:  [facility]  Advanced Directive information    No Known Allergies  Chief Complaint  Patient presents with   New Patient (Initial Visit)    Patient presents today for new patient appointment.     HPI: Patient is a 87 y.o. male new visit.  Was formally a patient at [facility] primary care but that facility was closed.  He generally is healthy.  He is a widower and lives alone and does his own cooking.  He has 4 children who live in the area who check on him regularly.  I noted in his old record he had had some weight loss but has gained 7 pounds back in the last year or so. He was on amlodipine for blood pressure but blood pressure today and recently at home has been normal. He has occasional aches and pains consistent with degenerative arthritis.  He denies recent falls. Reports that he sleeps well.  No complaints related to bowel or bladder.  He does have some hearing loss and could here today with some difficulty without his hearing aids.  Review of Systems:  Review of Systems  Eyes: Negative.   Respiratory: Negative.    Cardiovascular: Negative.   Musculoskeletal:  Positive for joint pain.  Neurological: Negative.   Psychiatric/Behavioral:  Positive for memory loss.   All other systems reviewed and are negative.  Past Medical History:  Diagnosis Date   Hypertension    Past Surgical History:  Procedure Laterality Date   EYE SURGERY     Social History:   reports that he quit smoking about 72 years ago. His smoking use included cigarettes. He has never used smokeless tobacco. He reports that he does not drink alcohol and does not use drugs.  Family History  Problem Relation Age of Onset   Diabetes type II Daughter    Diabetes type II Daughter     Medications: Patient's Medications  New Prescriptions   No medications on  file  Previous Medications   AMLODIPINE (NORVASC) 2.5 MG TABLET    TAKE 1 TABLET BY MOUTH EVERY DAY   MICONAZOLE (MICOTIN) 2 % POWDER    Apply topically as needed for itching.  Modified Medications   No medications on file  Discontinued Medications   No medications on file    Physical Exam:  There were no vitals filed for this visit. There is no height or weight on file to calculate BMI. Wt Readings from Last 3 Encounters:  08/18/19 144 lb 12.8 oz (65.7 kg)  05/26/19 183 lb (83 kg)  08/19/17 169 lb 12.8 oz (77 kg)    Physical Exam Vitals and nursing note reviewed.  Constitutional:      Appearance: Normal appearance.  HENT:     Head: Normocephalic.     Right Ear: Tympanic membrane normal.     Left Ear: Tympanic membrane normal.  Cardiovascular:     Rate and Rhythm: Normal rate. Rhythm irregular.     Pulses: Normal pulses.  Pulmonary:     Effort: Pulmonary effort is normal.     Breath sounds: Normal breath sounds.  Musculoskeletal:        General: Normal range of motion.  Skin:    General: Skin is warm.  Neurological:     General: No focal deficit present.     Mental Status: He is alert and  oriented to person, place, and time.  Psychiatric:        Mood and Affect: Mood normal.        Behavior: Behavior normal.        Thought Content: Thought content normal.    Labs reviewed: Basic Metabolic Panel: No results for input(s): NA, K, CL, CO2, GLUCOSE, BUN, CREATININE, CALCIUM, MG, PHOS, TSH in the last 8760 hours. Liver Function Tests: No results for input(s): AST, ALT, ALKPHOS, BILITOT, PROT, ALBUMIN in the last 8760 hours. No results for input(s): LIPASE, AMYLASE in the last 8760 hours. No results for input(s): AMMONIA in the last 8760 hours. CBC: No results for input(s): WBC, NEUTROABS, HGB, HCT, MCV, PLT in the last 8760 hours. Lipid Panel: No results for input(s): CHOL, HDL, LDLCALC, TRIG, CHOLHDL, LDLDIRECT in the last 8760 hours. TSH: No results for  input(s): TSH in the last 8760 hours. A1C: No results found for: HGBA1C   Assessment/Plan  1. Essential hypertension, benign Blood pressure is normal today.  I have asked that his daughter who is a Education administrator go by and check his pressure once or twice a week and let me know if pressure is consistently above 140/90 - CMP - CBC (no diff)  2. Atrial fibrillation, unspecified type (HCC) There is some irregularity but rate is normal at 65.  When his blood pressure is checked daughter will also monitor heart rate.  If greater than 100 may need some rate controlling medicine which would slow rate down as well as treat blood pressure   Jacalyn Lefevre, MD [facility] [phone]

## 2020-09-29 LAB — COMPREHENSIVE METABOLIC PANEL
AG Ratio: 1.6 (calc) (ref 1.0–2.5)
ALT: 6 U/L — ABNORMAL LOW (ref 9–46)
AST: 17 U/L (ref 10–35)
Albumin: 3.9 g/dL (ref 3.6–5.1)
Alkaline phosphatase (APISO): 45 U/L (ref 35–144)
BUN/Creatinine Ratio: 13 (calc) (ref 6–22)
BUN: 15 mg/dL (ref 7–25)
CO2: 29 mmol/L (ref 20–32)
Calcium: 9.1 mg/dL (ref 8.6–10.3)
Chloride: 104 mmol/L (ref 98–110)
Creat: 1.14 mg/dL — ABNORMAL HIGH (ref 0.70–1.11)
Globulin: 2.4 g/dL (calc) (ref 1.9–3.7)
Glucose, Bld: 76 mg/dL (ref 65–99)
Potassium: 4 mmol/L (ref 3.5–5.3)
Sodium: 140 mmol/L (ref 135–146)
Total Bilirubin: 0.9 mg/dL (ref 0.2–1.2)
Total Protein: 6.3 g/dL (ref 6.1–8.1)

## 2020-09-29 LAB — CBC
HCT: 40.8 % (ref 38.5–50.0)
Hemoglobin: 13.5 g/dL (ref 13.2–17.1)
MCH: 30.4 pg (ref 27.0–33.0)
MCHC: 33.1 g/dL (ref 32.0–36.0)
MCV: 91.9 fL (ref 80.0–100.0)
MPV: 10.6 fL (ref 7.5–12.5)
Platelets: 141 10*3/uL (ref 140–400)
RBC: 4.44 10*6/uL (ref 4.20–5.80)
RDW: 13 % (ref 11.0–15.0)
WBC: 3.1 10*3/uL — ABNORMAL LOW (ref 3.8–10.8)

## 2020-10-26 ENCOUNTER — Ambulatory Visit: Payer: Medicare Other | Admitting: Family Medicine

## 2020-12-22 ENCOUNTER — Encounter: Payer: Medicare Other | Admitting: Family

## 2020-12-22 ENCOUNTER — Other Ambulatory Visit: Payer: Self-pay

## 2020-12-22 NOTE — Progress Notes (Signed)
Subjective:    Review of Systems    ***       Objective:    There were no vitals filed for this visit. There is no height or weight on file to calculate BMI.  No flowsheet data found.  Current Medications (verified) Outpatient Encounter Medications as of 12/22/2020  Medication Sig  . amLODipine (NORVASC) 2.5 MG tablet TAKE 1 TABLET BY MOUTH EVERY DAY (Patient not taking: Reported on 09/28/2020)  . miconazole (MICOTIN) 2 % powder Apply topically as needed for itching.   No facility-administered encounter medications on file as of 12/22/2020.    Allergies (verified) Patient has no known allergies.   History: Past Medical History:  Diagnosis Date  . Hypertension    Past Surgical History:  Procedure Laterality Date  . EYE SURGERY     Family History  Problem Relation Age of Onset  . Diabetes type II Daughter   . Diabetes type II Daughter    Social History   Socioeconomic History  . Marital status: Widowed    Spouse name: Not on file  . Number of children: Not on file  . Years of education: Not on file  . Highest education level: Not on file  Occupational History  . Not on file  Tobacco Use  . Smoking status: Former    Types: Cigarettes    Quit date: 04/12/1948    Years since quitting: 72.7  . Smokeless tobacco: Never  Vaping Use  . Vaping Use: Never used  Substance and Sexual Activity  . Alcohol use: No  . Drug use: No  . Sexual activity: Not on file  Other Topics Concern  . Not on file  Social History Narrative   Tobacco use, amount per day now: None.   Past tobacco use, amount per day:   How many years did you use tobacco:   Alcohol use (drinks per week):   Diet:   Do you drink/eat things with caffeine: Occasionally soda.   Marital status:   Widowed                               What year were you married?   Do you live in a house, apartment, assisted living, condo, trailer, etc.? House   Is it one or more stories? One   How many persons live in  your home? 1   Do you have pets in your home?( please list) No.   Highest Level of education completed? High    Current or past profession: Truck Driver   Do you exercise?  No                                Type and how often?   Do you have a living will? No   Do you have a DNR form?      No                             If not, do you want to discuss one? Yes   Do you have signed POA/HPOA forms?      Yes                  If so, please bring to you appointment      Do you have any difficulty bathing or dressing yourself? No.  Do you have any difficulty preparing food or eating? No.   Do you have any difficulty managing your medications? No.   Do you have any difficulty managing your finances? No.   Do you have any difficulty affording your medications? No.   Social Determinants of Health   Financial Resource Strain: Not on file  Food Insecurity: Not on file  Transportation Needs: Not on file  Physical Activity: Not on file  Stress: Not on file  Social Connections: Not on file    Tobacco Counseling Counseling given: Not Answered   Clinical Intake:                 Diabetic?***         Activities of Daily Living No flowsheet data found.  Patient Care Team: Frederica Kuster, MD as PCP - General (Family Medicine)  Indicate any recent Medical Services you may have received from other than [facility] providers in the past year (date may be approximate).     Assessment:   This is a routine wellness examination for Noah Waters.  Hearing/Vision screen No results found.  Dietary issues and exercise activities discussed:     Goals Addressed   None   Depression Screen PHQ 2/9 Scores 09/28/2020 05/26/2019 08/19/2017 07/30/2016 04/21/2015  PHQ - 2 Score 0 0 0 0 0    Fall Risk Fall Risk  09/28/2020 05/26/2019 08/19/2017 07/30/2016  Falls in the past year? 0 0 No No  Number falls in past yr: 0 - - -  Injury with Fall? 0 - - -  Risk for fall due to : No Fall Risks - - -   Follow up Falls evaluation completed;Education provided;Falls prevention discussed Falls evaluation completed - -    FALL RISK PREVENTION PERTAINING TO THE HOME:  Any stairs in or around the home?  If so, are there any without handrails?  Home free of loose throw rugs in walkways, pet beds, electrical cords, etc?  Adequate lighting in your home to reduce risk of falls?   ASSISTIVE DEVICES UTILIZED TO PREVENT FALLS:  Life alert?  Use of a cane, walker or w/c?  Grab bars in the bathroom?  Shower chair or bench in shower?  Elevated toilet seat or a handicapped toilet?   TIMED UP AND GO:  Was the test performed? .  Length of time to ambulate 10 feet: *** sec.     Cognitive Function:        Immunizations Immunization History  Administered Date(s) Administered  . Influenza, High Dose Seasonal PF 01/18/2017, 12/27/2017, 01/06/2019  . Influenza-Unspecified 01/31/2013, 11/26/2019  . Pneumococcal Polysaccharide-23 01/18/2017            Qualifies for Shingles Vaccine?   Zostavax completed     Screening Tests Health Maintenance  Topic Date Due  . COVID-19 Vaccine (1) Never done  . TETANUS/TDAP  Never done  . Zoster Vaccines- Shingrix (1 of 2) Never done  . INFLUENZA VACCINE  10/31/2020  . HPV VACCINES  Aged Out    Health Maintenance  Health Maintenance Due  Topic Date Due  . COVID-19 Vaccine (1) Never done  . TETANUS/TDAP  Never done  . Zoster Vaccines- Shingrix (1 of 2) Never done  . INFLUENZA VACCINE  10/31/2020      Lung Cancer Screening: (Low Dose CT Chest recommended if Age 33-80 years, 30 pack-year currently smoking OR have quit w/in 15years.)  qualify.   Lung Cancer Screening Referral: ***  Additional Screening:  Hepatitis C Screening:  qualify; Completed ***  Vision Screening: Recommended annual ophthalmology exams for early detection of glaucoma and other disorders of the eye. Is the patient up to date with their annual eye exam?   Who  is the provider or what is the name of the office in which the patient attends annual eye exams? *** If pt is not established with a provider, would they like to be referred to a provider to establish care? .   Dental Screening: Recommended annual dental exams for proper oral hygiene  Community Resource Referral / Chronic Care Management: CRR required this visit?    CCM required this visit?       Plan:     I have personally reviewed and noted the following in the patient's chart:   Medical and social history Use of alcohol, tobacco or illicit drugs  Current medications and supplements including opioid prescriptions.  Functional ability and status Nutritional status Physical activity Advanced directives List of other physicians Hospitalizations, surgeries, and ER visits in previous 12 months Vitals Screenings to include cognitive, depression, and falls Referrals and appointments  In addition, I have reviewed and discussed with patient certain preventive protocols, quality metrics, and best practice recommendations. A written personalized care plan for preventive services as well as general preventive health recommendations were provided to patient.     Johney Maine Kourtnie Sachs, CMA   12/26/2020   Nurse Notes: ***     This encounter was created in error - please disregard. This encounter was created in error - please disregard.

## 2020-12-22 NOTE — Progress Notes (Signed)
This encounter was created in error - please disregard.

## 2020-12-29 ENCOUNTER — Ambulatory Visit: Payer: Medicare Other | Admitting: Family

## 2020-12-29 ENCOUNTER — Other Ambulatory Visit: Payer: Self-pay

## 2021-01-03 ENCOUNTER — Encounter: Payer: Self-pay | Admitting: Nurse Practitioner

## 2021-01-03 ENCOUNTER — Other Ambulatory Visit: Payer: Self-pay

## 2021-01-03 ENCOUNTER — Ambulatory Visit: Payer: Medicare Other | Admitting: Family Medicine

## 2021-01-03 ENCOUNTER — Encounter: Payer: Medicare Other | Admitting: Nurse Practitioner

## 2021-01-04 NOTE — Progress Notes (Signed)
This encounter was created in error - please disregard.

## 2021-01-05 ENCOUNTER — Other Ambulatory Visit: Payer: Self-pay

## 2021-01-05 ENCOUNTER — Encounter: Payer: Medicare Other | Admitting: Nurse Practitioner

## 2021-01-06 NOTE — Progress Notes (Signed)
This encounter was created in error - please disregard.

## 2021-03-28 ENCOUNTER — Ambulatory Visit: Payer: Medicare Other | Admitting: Family Medicine

## 2022-01-01 ENCOUNTER — Inpatient Hospital Stay (HOSPITAL_COMMUNITY): Payer: Medicare Other

## 2022-01-01 ENCOUNTER — Encounter (HOSPITAL_COMMUNITY): Payer: Self-pay

## 2022-01-01 ENCOUNTER — Emergency Department (HOSPITAL_COMMUNITY): Payer: Medicare Other

## 2022-01-01 ENCOUNTER — Other Ambulatory Visit: Payer: Self-pay

## 2022-01-01 ENCOUNTER — Inpatient Hospital Stay (HOSPITAL_COMMUNITY)
Admission: EM | Admit: 2022-01-01 | Discharge: 2022-01-08 | DRG: 522 | Disposition: A | Discharge status: Skilled Nursing Facility | Payer: Medicare Other | Attending: Internal Medicine | Admitting: Internal Medicine

## 2022-01-01 DIAGNOSIS — M25572 Pain in left ankle and joints of left foot: Secondary | ICD-10-CM | POA: Diagnosis not present

## 2022-01-01 DIAGNOSIS — E876 Hypokalemia: Secondary | ICD-10-CM | POA: Diagnosis not present

## 2022-01-01 DIAGNOSIS — Y92009 Unspecified place in unspecified non-institutional (private) residence as the place of occurrence of the external cause: Secondary | ICD-10-CM | POA: Diagnosis not present

## 2022-01-01 DIAGNOSIS — R262 Difficulty in walking, not elsewhere classified: Secondary | ICD-10-CM | POA: Diagnosis not present

## 2022-01-01 DIAGNOSIS — M542 Cervicalgia: Secondary | ICD-10-CM | POA: Diagnosis not present

## 2022-01-01 DIAGNOSIS — Z743 Need for continuous supervision: Secondary | ICD-10-CM | POA: Diagnosis not present

## 2022-01-01 DIAGNOSIS — G319 Degenerative disease of nervous system, unspecified: Secondary | ICD-10-CM | POA: Diagnosis not present

## 2022-01-01 DIAGNOSIS — Z043 Encounter for examination and observation following other accident: Secondary | ICD-10-CM | POA: Diagnosis not present

## 2022-01-01 DIAGNOSIS — Z87891 Personal history of nicotine dependence: Secondary | ICD-10-CM

## 2022-01-01 DIAGNOSIS — W19XXXA Unspecified fall, initial encounter: Secondary | ICD-10-CM

## 2022-01-01 DIAGNOSIS — S01311A Laceration without foreign body of right ear, initial encounter: Secondary | ICD-10-CM | POA: Diagnosis present

## 2022-01-01 DIAGNOSIS — S72041A Displaced fracture of base of neck of right femur, initial encounter for closed fracture: Secondary | ICD-10-CM | POA: Diagnosis not present

## 2022-01-01 DIAGNOSIS — Z7401 Bed confinement status: Secondary | ICD-10-CM | POA: Diagnosis not present

## 2022-01-01 DIAGNOSIS — F05 Delirium due to known physiological condition: Secondary | ICD-10-CM | POA: Diagnosis present

## 2022-01-01 DIAGNOSIS — S72011A Unspecified intracapsular fracture of right femur, initial encounter for closed fracture: Principal | ICD-10-CM | POA: Diagnosis present

## 2022-01-01 DIAGNOSIS — I10 Essential (primary) hypertension: Secondary | ICD-10-CM | POA: Diagnosis present

## 2022-01-01 DIAGNOSIS — Z66 Do not resuscitate: Secondary | ICD-10-CM | POA: Diagnosis not present

## 2022-01-01 DIAGNOSIS — Z01818 Encounter for other preprocedural examination: Secondary | ICD-10-CM | POA: Diagnosis not present

## 2022-01-01 DIAGNOSIS — R2681 Unsteadiness on feet: Secondary | ICD-10-CM | POA: Diagnosis not present

## 2022-01-01 DIAGNOSIS — I700 Atherosclerosis of aorta: Secondary | ICD-10-CM | POA: Diagnosis not present

## 2022-01-01 DIAGNOSIS — S72001A Fracture of unspecified part of neck of right femur, initial encounter for closed fracture: Principal | ICD-10-CM

## 2022-01-01 DIAGNOSIS — M11261 Other chondrocalcinosis, right knee: Secondary | ICD-10-CM | POA: Diagnosis not present

## 2022-01-01 DIAGNOSIS — I482 Chronic atrial fibrillation, unspecified: Secondary | ICD-10-CM | POA: Diagnosis not present

## 2022-01-01 DIAGNOSIS — M47819 Spondylosis without myelopathy or radiculopathy, site unspecified: Secondary | ICD-10-CM | POA: Diagnosis present

## 2022-01-01 DIAGNOSIS — Z833 Family history of diabetes mellitus: Secondary | ICD-10-CM

## 2022-01-01 DIAGNOSIS — R6889 Other general symptoms and signs: Secondary | ICD-10-CM | POA: Diagnosis not present

## 2022-01-01 DIAGNOSIS — Z471 Aftercare following joint replacement surgery: Secondary | ICD-10-CM | POA: Diagnosis not present

## 2022-01-01 DIAGNOSIS — Z9889 Other specified postprocedural states: Secondary | ICD-10-CM | POA: Diagnosis not present

## 2022-01-01 DIAGNOSIS — M25551 Pain in right hip: Secondary | ICD-10-CM | POA: Diagnosis not present

## 2022-01-01 DIAGNOSIS — R404 Transient alteration of awareness: Secondary | ICD-10-CM | POA: Diagnosis not present

## 2022-01-01 DIAGNOSIS — R410 Disorientation, unspecified: Secondary | ICD-10-CM | POA: Diagnosis present

## 2022-01-01 DIAGNOSIS — F03911 Unspecified dementia, unspecified severity, with agitation: Secondary | ICD-10-CM | POA: Diagnosis not present

## 2022-01-01 DIAGNOSIS — D649 Anemia, unspecified: Secondary | ICD-10-CM | POA: Diagnosis not present

## 2022-01-01 DIAGNOSIS — Z96641 Presence of right artificial hip joint: Secondary | ICD-10-CM | POA: Diagnosis not present

## 2022-01-01 DIAGNOSIS — D696 Thrombocytopenia, unspecified: Secondary | ICD-10-CM | POA: Diagnosis not present

## 2022-01-01 DIAGNOSIS — W010XXA Fall on same level from slipping, tripping and stumbling without subsequent striking against object, initial encounter: Secondary | ICD-10-CM | POA: Diagnosis present

## 2022-01-01 DIAGNOSIS — I4891 Unspecified atrial fibrillation: Secondary | ICD-10-CM

## 2022-01-01 DIAGNOSIS — G8911 Acute pain due to trauma: Secondary | ICD-10-CM | POA: Diagnosis not present

## 2022-01-01 DIAGNOSIS — R1311 Dysphagia, oral phase: Secondary | ICD-10-CM | POA: Diagnosis not present

## 2022-01-01 DIAGNOSIS — S0990XA Unspecified injury of head, initial encounter: Secondary | ICD-10-CM | POA: Diagnosis not present

## 2022-01-01 DIAGNOSIS — G919 Hydrocephalus, unspecified: Secondary | ICD-10-CM | POA: Diagnosis not present

## 2022-01-01 DIAGNOSIS — M6281 Muscle weakness (generalized): Secondary | ICD-10-CM | POA: Diagnosis not present

## 2022-01-01 DIAGNOSIS — S72001S Fracture of unspecified part of neck of right femur, sequela: Secondary | ICD-10-CM | POA: Diagnosis not present

## 2022-01-01 DIAGNOSIS — Z9181 History of falling: Secondary | ICD-10-CM | POA: Diagnosis not present

## 2022-01-01 LAB — BASIC METABOLIC PANEL
Anion gap: 9 (ref 5–15)
BUN: 12 mg/dL (ref 8–23)
CO2: 26 mmol/L (ref 22–32)
Calcium: 8.5 mg/dL — ABNORMAL LOW (ref 8.9–10.3)
Chloride: 102 mmol/L (ref 98–111)
Creatinine, Ser: 0.95 mg/dL (ref 0.61–1.24)
GFR, Estimated: >60 mL/min (ref >60)
Glucose, Bld: 134 mg/dL — ABNORMAL HIGH (ref 70–99)
Potassium: 3.2 mmol/L — ABNORMAL LOW (ref 3.5–5.1)
Sodium: 137 mmol/L (ref 135–145)

## 2022-01-01 LAB — CBC
HCT: 40.3 % (ref 39.0–52.0)
Hemoglobin: 13.6 g/dL (ref 13.0–17.0)
MCH: 30.8 pg (ref 26.0–34.0)
MCHC: 33.7 g/dL (ref 30.0–36.0)
MCV: 91.4 fL (ref 80.0–100.0)
Platelets: 126 10*3/uL — ABNORMAL LOW (ref 150–400)
RBC: 4.41 MIL/uL (ref 4.22–5.81)
RDW: 13.8 % (ref 11.5–15.5)
WBC: 6.4 10*3/uL (ref 4.0–10.5)
nRBC: 0 % (ref 0.0–0.2)

## 2022-01-01 LAB — PROTIME-INR
INR: 1.2 (ref 0.8–1.2)
Prothrombin Time: 15.1 seconds (ref 11.4–15.2)

## 2022-01-01 LAB — TYPE AND SCREEN
ABO/RH(D): O NEG
Antibody Screen: NEGATIVE

## 2022-01-01 MED ORDER — HYDROMORPHONE HCL 1 MG/ML IJ SOLN
1.0000 mg | INTRAMUSCULAR | Status: DC | PRN
  Administered 2022-01-02 (×3): 1 mg via INTRAVENOUS
  Filled 2022-01-01 (×3): qty 1

## 2022-01-01 MED ORDER — MORPHINE SULFATE (PF) 4 MG/ML IV SOLN
4.0000 mg | Freq: Once | INTRAVENOUS | Status: AC
  Administered 2022-01-01: 4 mg via INTRAVENOUS
  Filled 2022-01-01: qty 1

## 2022-01-01 MED ORDER — FENTANYL CITRATE PF 50 MCG/ML IJ SOSY
25.0000 ug | PREFILLED_SYRINGE | Freq: Once | INTRAMUSCULAR | Status: AC
  Administered 2022-01-01: 25 ug via INTRAVENOUS
  Filled 2022-01-01: qty 1

## 2022-01-01 MED ORDER — HYDROCODONE-ACETAMINOPHEN 5-325 MG PO TABS: 1.0000 | ORAL_TABLET | Freq: Four times a day (QID) | ORAL | Status: DC | PRN

## 2022-01-01 MED ORDER — POTASSIUM CHLORIDE 20 MEQ PO PACK: 60.0000 meq | PACK | Freq: Once | ORAL | Status: DC

## 2022-01-01 MED ORDER — SENNOSIDES-DOCUSATE SODIUM 8.6-50 MG PO TABS: 1.0000 | ORAL_TABLET | Freq: Every evening | ORAL | Status: DC | PRN

## 2022-01-01 MED ORDER — ONDANSETRON HCL 4 MG/2ML IJ SOLN: 4.0000 mg | Freq: Four times a day (QID) | INTRAMUSCULAR | Status: DC | PRN

## 2022-01-01 MED ORDER — HALOPERIDOL LACTATE 5 MG/ML IJ SOLN
1.0000 mg | Freq: Once | INTRAMUSCULAR | Status: AC | PRN
  Administered 2022-01-01: 1 mg via INTRAVENOUS
  Filled 2022-01-01: qty 1

## 2022-01-01 MED ORDER — HYDROMORPHONE HCL 1 MG/ML IJ SOLN
0.5000 mg | INTRAMUSCULAR | Status: DC | PRN
  Administered 2022-01-01: 0.5 mg via INTRAVENOUS
  Filled 2022-01-01: qty 1

## 2022-01-01 MED ORDER — HALOPERIDOL LACTATE 5 MG/ML IJ SOLN
2.0000 mg | Freq: Once | INTRAMUSCULAR | Status: AC | PRN
  Administered 2022-01-01: 2 mg via INTRAVENOUS
  Filled 2022-01-01: qty 1

## 2022-01-01 NOTE — Hospital Course (Signed)
Noah Waters is a 88 y.o. male with medical history significant for chronic atrial fibrillation not on anticoagulation, HTN who is admitted with an acute right femoral neck fracture.

## 2022-01-01 NOTE — ED Provider Notes (Signed)
[facility] EMERGENCY DEPARTMENT Provider Note   CSN: [id number] Arrival date & time: 01/01/22  1658     History  Chief Complaint  Patient presents with   Lytle Michaels    Noah Waters is a 88 y.o. male with history of hypertension who presents emergency department after a fall.  Patient states that he was in his bedroom and tripped, falling and hitting his right hip and the right side of his head.  No loss of consciousness.  He is not on chronic anticoagulation.  He is complaining of a laceration to his right ear as well as right hip pain.  No numbness or tingling.  Having difficulty straightening the right leg due to pain.   Fall       Home Medications Prior to Admission medications   Medication Sig Start Date End Date Taking? Authorizing Provider  amLODipine (NORVASC) 2.5 MG tablet TAKE 1 TABLET BY MOUTH EVERY DAY Patient not taking: Reported on 09/28/2020 09/18/20   Horald Pollen, MD  miconazole (MICOTIN) 2 % powder Apply topically as needed for itching. Patient not taking: Reported on 01/03/2021 08/18/19   Darr, Edison Nasuti, PA-C      Allergies    Patient has no known allergies.    Review of Systems   Review of Systems  Musculoskeletal:  Positive for arthralgias.  Skin:  Positive for wound.  All other systems reviewed and are negative.   Physical Exam Updated Vital Signs BP (!) 175/89   Pulse (!) 58   Temp 98.2 F (36.8 C) (Oral)   Resp 17   SpO2 98%  Physical Exam Vitals and nursing note reviewed.  Constitutional:      Appearance: Normal appearance.  HENT:     Head: Normocephalic and atraumatic.     Ears:     Comments: Very hard of hearing. Approximately 3 mm laceration over the auricle of right ear, bleeding is controlled Eyes:     Conjunctiva/sclera: Conjunctivae normal.  Cardiovascular:     Rate and Rhythm: Normal rate and regular rhythm.     Pulses:          Dorsalis pedis pulses are 2+ on the right side and 2+ on the left side.        Posterior tibial pulses are 2+ on the right side and 2+ on the left side.  Pulmonary:     Effort: Pulmonary effort is normal. No respiratory distress.     Breath sounds: Normal breath sounds.  Chest:     Comments: Chest wall stable Abdominal:     General: There is no distension.     Palpations: Abdomen is soft.     Tenderness: There is no abdominal tenderness.  Musculoskeletal:     Comments: Pain to palpation over posterior right hip at the level of the femur head with some crepitus. Pelvic crests without deformity or pain. Once legs are extended, right leg is shortened and externally rotated  Skin:    General: Skin is warm and dry.  Neurological:     General: No focal deficit present.     Mental Status: He is alert.     Comments: Neuro: Speech is clear, able to follow commands. PERRLA. EOMI. Sensation intact throughout. No facial droop or asymmetry.      ED Results / Procedures / Treatments   Labs (all labs ordered are listed, but only abnormal results are displayed) Labs Reviewed  BASIC METABOLIC PANEL  CBC WITH DIFFERENTIAL/PLATELET  PROTIME-INR  TYPE AND  SCREEN  ABO/RH    EKG None  Radiology CT Hip Right Wo Contrast  Result Date: 01/01/2022 CLINICAL DATA:  Right hip pain.  Fall EXAM: CT OF THE RIGHT HIP WITHOUT CONTRAST TECHNIQUE: Multidetector CT imaging of the right hip was performed according to the standard protocol. Multiplanar CT image reconstructions were also generated. RADIATION DOSE REDUCTION: This exam was performed according to the departmental dose-optimization program which includes automated exposure control, adjustment of the mA and/or kV according to patient size and/or use of iterative reconstruction technique. COMPARISON:  None Available. FINDINGS: Bones/Joint/Cartilage Acute right femoral neck fracture, partially subcapital in location. There is shortening and external rotation of the femoral shaft. Impaction with varus and anterior apex angulation at the  fracture site. No involvement of the intertrochanteric region. Femoroacetabular joint alignment is maintained without dislocation. The right hemipelvis is intact without fracture or diastasis. No lytic or sclerotic bone lesions are seen. Degenerative disc disease of the imaged lower lumbar spine. Ligaments Suboptimally assessed by CT. Muscles and Tendons No acute musculotendinous abnormality by CT. Soft tissues No hematoma within the soft tissues.  Aortoiliac atherosclerosis. IMPRESSION: Acute right femoral neck fracture, as described above. Aortic Atherosclerosis (ICD10-I70.0). Electronically Signed   By: Davina Poke D.O.   On: 01/01/2022 19:12   CT Head Wo Contrast  Result Date: 01/01/2022 CLINICAL DATA:  Status post fall. EXAM: CT HEAD WITHOUT CONTRAST TECHNIQUE: Contiguous axial images were obtained from the base of the skull through the vertex without intravenous contrast. RADIATION DOSE REDUCTION: This exam was performed according to the departmental dose-optimization program which includes automated exposure control, adjustment of the mA and/or kV according to patient size and/or use of iterative reconstruction technique. COMPARISON:  None Available. FINDINGS: Brain: There is moderate severity cerebral atrophy with widening of the extra-axial spaces and ventricular dilatation. There are areas of decreased attenuation within the white matter tracts of the supratentorial brain, consistent with microvascular disease changes. Vascular: No hyperdense vessel or unexpected calcification. Skull: Normal. Negative for fracture or focal lesion. Sinuses/Orbits: No acute finding. 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 IMPRESSION: 1. No acute intracranial abnormality. 2. Generalized cerebral atrophy with widening of the extra-axial spaces and ventricular dilatation. Electronically Signed   By: Virgina Norfolk M.D.   On: 01/01/2022 19:11   CT Cervical Spine Wo  Contrast  Result Date: 01/01/2022 CLINICAL DATA:  Ground level fall at home, right hip pain EXAM: CT CERVICAL SPINE WITHOUT CONTRAST TECHNIQUE: Multidetector CT imaging of the cervical spine was performed without intravenous contrast. Multiplanar CT image reconstructions were also generated. RADIATION DOSE REDUCTION: This exam was performed according to the departmental dose-optimization program which includes automated exposure control, adjustment of the mA and/or kV according to patient size and/or use of iterative reconstruction technique. COMPARISON:  None Available. FINDINGS: Alignment: Alignment is anatomic. Skull base and vertebrae: No acute fracture. No primary bone lesion or focal pathologic process. Soft tissues and spinal canal: No prevertebral fluid or swelling. No visible canal hematoma. Disc levels: There is diffuse multilevel spondylosis and facet hypertrophy. Circumferential disc osteophyte formation is most pronounced from C3-4 through C6-7, with central canal and neural foraminal encroachment greatest at C3-4, C4-5, and C6-7. Upper chest: Deviation of the trachea to the right by an enlarged heterogeneous thyroid, most consistent with goiter. A non-emergent outpatient thyroid ultrasound could be considered if clinically warranted given patient age. Reference: J Am Coll Radiol. 2015 Feb;12(2): 143-50 The airway is patent.  Lung  apices are clear. Other: Reconstructed images demonstrate no additional findings. IMPRESSION: 1. No evidence of cervical spine fracture. 2. Extensive multilevel spondylosis and facet hypertrophy. Electronically Signed   By: Randa Ngo M.D.   On: 01/01/2022 19:03    Procedures Procedures    Medications Ordered in ED Medications  fentaNYL (SUBLIMAZE) injection 25 mcg (25 mcg Intravenous Given 01/01/22 1937)  morphine (PF) 4 MG/ML injection 4 mg (4 mg Intravenous Given 01/01/22 2027)    ED Course/ Medical Decision Making/ A&P                           Medical  Decision Making Amount and/or Complexity of Data Reviewed Radiology: ordered.  Risk Prescription drug management. Decision regarding hospitalization.  This patient is a 88 y.o. male  who presents to the ED for concern of fall.   Past Medical History / Co-morbidities: HTN  Additional history: Chart reviewed. Pertinent results include: Documented atrial fibrillation by cardiologist in June 2022 but rate controlled without medication. No longer on hypertensive medications.   Physical Exam: Physical exam performed. The pertinent findings include: Alert and oriented. Hypertensive but otherwise normal vital signs. Right leg shortened and externally rotated. Neurovascularly intact in all extremities. Heart regular rate and rhythm on my exam, however later EKG showed atrial fibrillation.   Lab Tests/Imaging studies: I personally interpreted labs/imaging and the pertinent results include:  Pre-operative labs pending. CT head and cervical spine without acute abnormalities. CT right hip with right femoral neck fracture, subcapital. I reviewed the images and agree with the radiologist interpretation.   Cardiac monitoring: EKG obtained and interpreted by my attending physician which shows: atrial fibrillation with rate of 70 bpm   Medications: I ordered medication including IV analgesics for pain.  I have reviewed the patients home medicines and have made adjustments as needed.  Consultations obtained: I consulted with Dr Alvan Dame with orthopedic surgery who recommended medical admission for pain control and NPO after midnight.  I consulted with hospitalist Dr Posey Pronto who will admit.    Disposition: After consideration of the diagnostic results and the patients response to treatment, I feel that patient would benefit from admission for surgical repair of right femoral neck fracture likely tomorrow. Plan discussed with patient and daughter at bedside who are agreeable  Final Clinical Impression(s) /  ED Diagnoses Final diagnoses:  Closed fracture of neck of right femur, initial encounter ([place])  Fall, initial encounter  Atrial fibrillation with controlled ventricular rate ([place])    Rx / DC Orders ED Discharge Orders     None      Portions of this report may have been transcribed using voice recognition software. Every effort was made to ensure accuracy; however, inadvertent computerized transcription errors may be present.    Estill Cotta 01/01/22 2052    Davonna Belling, MD 01/01/22 2358

## 2022-01-01 NOTE — Assessment & Plan Note (Signed)
Occurring after unwitnessed fall at home.  Patient delirious and unable to provide history regarding circumstances around fall.  Lives alone and normally ambulates with use of cane/walker. -Orthopedics to consult -Keep n.p.o. after midnight -Continue analgesics as needed

## 2022-01-01 NOTE — ED Notes (Signed)
Pt in room with R middle ear lac. Bleeding controlled at this time. Pt grimacing in pain with movement to R upper femur near tailbone.

## 2022-01-01 NOTE — ED Notes (Signed)
Patient's right ear cleaned with normal saline flush and gauze.

## 2022-01-01 NOTE — H&P (Signed)
History and Physical    Noah Waters [id number] DOB: 04/03/1924 DOA: 01/01/2022  PCP: Wardell Honour, MD  Patient coming from: Home  I have personally briefly reviewed patient's old medical records in [place]  Chief Complaint: Right hip pain after fall  HPI: Noah Waters is a 88 y.o. male with medical history significant for chronic atrial fibrillation not on anticoagulation, HTN who presented to the ED for evaluation of right hip pain after fall.  History is limited from patient due to delirium and is otherwise supplemented by EDP, chart review, and daughter at bedside.  Per daughter, patient lives alone and is functionally independent.  He normally ambulates with the use of a cane or walker without issue.  He is able to complete his ADLs.  He does have some memory issues but otherwise converses well at baseline.  Daughter was visiting him earlier today and he seems to be at his baseline.  About 30 minutes after she left she received a call that he fell at home.  He is not able to tell me the circumstances around his fall.  He was apparently complaining of right hip pain.  He was also noted to have a small laceration to his right ear.  EMS were called and he was taken to the ED where he was found to have an acute right femoral neck fracture.  ED Course  Labs/Imaging on admission: I have personally reviewed following labs and imaging studies.  Initial vitals showed BP 188/94, pulse 77, RR 17, temp 98.2 F, SPO2 98% on room air.  Labs show WBC 6.4, hemoglobin 13.6, platelets 126,000, sodium 137, potassium 3.2, bicarb 26, BUN 12, creatinine 0.95, serum glucose 134, INR 1.2.  CT head negative for acute endocrine abnormality.  Generalized cervical atrophy with widening of the extra-axial spaces and ventricular dilatation noted.  CT cervical spine without contrast negative for cervical spine fracture.  Extensive multilevel spondylosis and facet hypertrophy seen.  CT right hip  without contrast shows acute right femoral neck fracture, aortic atherosclerosis.  Patient was given IV morphine 4 mg, IV fentanyl 25 mcg.  EDP discussed with on-call orthopedic surgery, Dr. Alvan Dame, who recommended medical admission and n.p.o. after midnight.  The hospitalist service was consulted to admit for further evaluation and management.  Review of Systems: All systems reviewed and are negative except as documented in history of present illness above.   Past Medical History:  Diagnosis Date   Hypertension     Past Surgical History:  Procedure Laterality Date   EYE SURGERY      Social History:  reports that he quit smoking about 73 years ago. His smoking use included cigarettes. He has never used smokeless tobacco. He reports that he does not drink alcohol and does not use drugs.  No Known Allergies  Family History  Problem Relation Age of Onset   Diabetes type II Daughter    Diabetes type II Daughter      Prior to Admission medications   Medication Sig Start Date End Date Taking? Authorizing Provider  amLODipine (NORVASC) 2.5 MG tablet TAKE 1 TABLET BY MOUTH EVERY DAY Patient not taking: Reported on 09/28/2020 09/18/20   Horald Pollen, MD  miconazole (MICOTIN) 2 % powder Apply topically as needed for itching. Patient not taking: Reported on 01/03/2021 08/18/19   Roland Rack, PA-C    Physical Exam: Vitals:   01/01/22 1731 01/01/22 1850 01/01/22 2131  BP: (!) 188/94 (!) 175/89 (!) 162/88  Pulse:  77 (!) 58 82  Resp: 17 17 18   Temp: 98.2 F (36.8 C)  98.1 F (36.7 C)  TempSrc: Oral    SpO2: 98% 98% 97%   Exam limited from patient due to delirium and agitation. Constitutional: Elderly man, awake, agitated, rolling around in bed and exacerbating pain on his right hip Eyes:  lids and conjunctivae normal ENMT: Mucous membranes are moist. Posterior pharynx clear of any exudate or lesions.Normal dentition.  Neck: normal, supple, no masses. Respiratory: clear to  auscultation bilaterally, no wheezing, no crackles. Normal respiratory effort. Cardiovascular: Regular rate and rhythm, no murmurs / rubs / gallops.  Abdomen: no tenderness, no masses palpated.  Musculoskeletal: Safety mittens in place.  No clubbing / cyanosis.  Agitated and rolling around in bed causing worsening pain at his right hip fracture Skin: Small laceration at the ear without active bleeding Neurologic:  Sensation intact.  Moving all extremities Psychiatric: Awake, alert, agitated.  Can tell me his name but otherwise not oriented to place, time, situation.  EKG: Personally reviewed. Atrial fibrillation, rate 70.  Similar to prior from 2015.  Assessment/Plan Principal Problem:   Fracture of femoral neck, right, closed (HCC) Active Problems:   Delirium   Essential hypertension, benign   Chronic atrial fibrillation (HCC)   Noah Waters is a 88 y.o. male with medical history significant for chronic atrial fibrillation not on anticoagulation, HTN who is admitted with an acute right femoral neck fracture.  Assessment and Plan: * Fracture of femoral neck, right, closed ([place]) Occurring after unwitnessed fall at home.  Patient delirious and unable to provide history regarding circumstances around fall.  Lives alone and normally ambulates with use of cane/walker. -Orthopedics to consult -Keep n.p.o. after midnight -Continue analgesics as needed  Delirium Has some memory issues at baseline per family.  Agitated and delirious on admit likely in the setting of pain from his hip fracture.  Restlessness is exacerbating his pain. -Safety mittens in place -Try IV Haldol if needed -Continue IV Dilaudid for pain control -Delirium and fall precautions  Chronic atrial fibrillation (HCC) Known history of chronic atrial fibrillation not on anticoagulation or any other medications.  Rate controlled on admission. -Monitor on telemetry  Essential hypertension, benign Not taking any  medications as an outpatient.  BP elevated in setting of pain.  Continue analgesics.  DVT prophylaxis: SCDs Start: 01/01/22 2155 Code Status: DNR, confirmed with patient's daughter on admission Family Communication: Daughter at bedside Disposition Plan: From home, dispo pending clinical progress Consults called: Orthopedics Severity of Illness: The appropriate patient status for this patient is INPATIENT. Inpatient status is judged to be reasonable and necessary in order to provide the required intensity of service to ensure the patient's safety. The patient's presenting symptoms, physical exam findings, and initial radiographic and laboratory data in the context of their chronic comorbidities is felt to place them at high risk for further clinical deterioration. Furthermore, it is not anticipated that the patient will be medically stable for discharge from the hospital within 2 midnights of admission.   * I certify that at the point of admission it is my clinical judgment that the patient will require inpatient hospital care spanning beyond 2 midnights from the point of admission due to high intensity of service, high risk for further deterioration and high frequency of surveillance required.Zada Finders MD [facility]  If 7PM-7AM, please contact night-coverage [email or web address]  01/01/2022, 11:09 PM

## 2022-01-01 NOTE — Assessment & Plan Note (Signed)
Known history of chronic atrial fibrillation not on anticoagulation or any other medications.  Rate controlled on admission. -Monitor on telemetry

## 2022-01-01 NOTE — Assessment & Plan Note (Signed)
Not taking any medications as an outpatient.  BP elevated in setting of pain.  Continue analgesics.

## 2022-01-01 NOTE — ED Triage Notes (Signed)
Pt BIBA for a ground level fall at home. Pt A&O x2 c/o R hip pain with a R ear LC. Denies LOC at scene. Pt lives home alone.  CBG 136

## 2022-01-01 NOTE — Assessment & Plan Note (Signed)
Has some memory issues at baseline per family.  Agitated and delirious on admit likely in the setting of pain from his hip fracture.  Restlessness is exacerbating his pain. -Safety mittens in place -Try IV Haldol if needed -Continue IV Dilaudid for pain control -Delirium and fall precautions

## 2022-01-02 ENCOUNTER — Encounter (HOSPITAL_COMMUNITY): Payer: Self-pay | Admitting: Internal Medicine

## 2022-01-02 ENCOUNTER — Inpatient Hospital Stay (HOSPITAL_COMMUNITY): Payer: Medicare Other

## 2022-01-02 ENCOUNTER — Inpatient Hospital Stay (HOSPITAL_COMMUNITY): Payer: Medicare Other | Admitting: Anesthesiology

## 2022-01-02 ENCOUNTER — Encounter (HOSPITAL_COMMUNITY)
Admission: EM | Disposition: A | Discharge status: Skilled Nursing Facility | Payer: Self-pay | Source: Home / Self Care | Attending: Internal Medicine

## 2022-01-02 ENCOUNTER — Other Ambulatory Visit: Payer: Self-pay

## 2022-01-02 DIAGNOSIS — R410 Disorientation, unspecified: Secondary | ICD-10-CM

## 2022-01-02 DIAGNOSIS — S72001A Fracture of unspecified part of neck of right femur, initial encounter for closed fracture: Secondary | ICD-10-CM | POA: Diagnosis not present

## 2022-01-02 DIAGNOSIS — I10 Essential (primary) hypertension: Secondary | ICD-10-CM | POA: Diagnosis not present

## 2022-01-02 DIAGNOSIS — I482 Chronic atrial fibrillation, unspecified: Secondary | ICD-10-CM

## 2022-01-02 HISTORY — PX: ANTERIOR APPROACH HEMI HIP ARTHROPLASTY: SHX6690

## 2022-01-02 LAB — CBC
HCT: 40.2 % (ref 39.0–52.0)
Hemoglobin: 13 g/dL (ref 13.0–17.0)
MCH: 30.2 pg (ref 26.0–34.0)
MCHC: 32.3 g/dL (ref 30.0–36.0)
MCV: 93.3 fL (ref 80.0–100.0)
Platelets: 129 10*3/uL — ABNORMAL LOW (ref 150–400)
RBC: 4.31 MIL/uL (ref 4.22–5.81)
RDW: 13.9 % (ref 11.5–15.5)
WBC: 7.1 10*3/uL (ref 4.0–10.5)
nRBC: 0 % (ref 0.0–0.2)

## 2022-01-02 LAB — BASIC METABOLIC PANEL
Anion gap: 10 (ref 5–15)
BUN: 12 mg/dL (ref 8–23)
CO2: 27 mmol/L (ref 22–32)
Calcium: 8.7 mg/dL — ABNORMAL LOW (ref 8.9–10.3)
Chloride: 102 mmol/L (ref 98–111)
Creatinine, Ser: 0.94 mg/dL (ref 0.61–1.24)
GFR, Estimated: >60 mL/min (ref >60)
Glucose, Bld: 129 mg/dL — ABNORMAL HIGH (ref 70–99)
Potassium: 3.5 mmol/L (ref 3.5–5.1)
Sodium: 139 mmol/L (ref 135–145)

## 2022-01-02 LAB — SURGICAL PCR SCREEN
MRSA, PCR: NEGATIVE
Staphylococcus aureus: NEGATIVE

## 2022-01-02 LAB — MAGNESIUM: Magnesium: 1.9 mg/dL (ref 1.7–2.4)

## 2022-01-02 LAB — GLUCOSE, CAPILLARY: Glucose-Capillary: 118 mg/dL — ABNORMAL HIGH (ref 70–99)

## 2022-01-02 LAB — ABO/RH: ABO/RH(D): O NEG

## 2022-01-02 SURGERY — HEMIARTHROPLASTY, HIP, DIRECT ANTERIOR APPROACH, FOR FRACTURE
Anesthesia: General | Laterality: Right

## 2022-01-02 MED ORDER — 0.9 % SODIUM CHLORIDE (POUR BTL) OPTIME
TOPICAL | Status: DC | PRN
  Administered 2022-01-02: 1000 mL

## 2022-01-02 MED ORDER — SUGAMMADEX SODIUM 200 MG/2ML IV SOLN
INTRAVENOUS | Status: DC | PRN
  Administered 2022-01-02: 200 mg via INTRAVENOUS

## 2022-01-02 MED ORDER — PROPOFOL 10 MG/ML IV BOLUS
INTRAVENOUS | Status: DC | PRN
  Administered 2022-01-02: 70 mg via INTRAVENOUS

## 2022-01-02 MED ORDER — TRANEXAMIC ACID-NACL 1000-0.7 MG/100ML-% IV SOLN
1000.0000 mg | INTRAVENOUS | Status: AC
  Administered 2022-01-02: 1000 mg via INTRAVENOUS
  Filled 2022-01-02: qty 100

## 2022-01-02 MED ORDER — CLONIDINE HCL (ANALGESIA) 100 MCG/ML EP SOLN
EPIDURAL | Status: DC | PRN
  Administered 2022-01-02: 100 ug

## 2022-01-02 MED ORDER — MORPHINE SULFATE (PF) 2 MG/ML IV SOLN: 1.0000 mg | INTRAVENOUS | Status: DC | PRN

## 2022-01-02 MED ORDER — ONDANSETRON HCL 4 MG/2ML IJ SOLN: 4.0000 mg | Freq: Four times a day (QID) | INTRAMUSCULAR | Status: DC | PRN

## 2022-01-02 MED ORDER — ONDANSETRON HCL 4 MG/2ML IJ SOLN: 4.0000 mg | Freq: Once | INTRAMUSCULAR | Status: DC | PRN

## 2022-01-02 MED ORDER — LACTATED RINGERS IV SOLN: INTRAVENOUS | Status: DC

## 2022-01-02 MED ORDER — ALBUMIN HUMAN 5 % IV SOLN: INTRAVENOUS | Status: DC | PRN

## 2022-01-02 MED ORDER — POVIDONE-IODINE 10 % EX SWAB: 2.0000 | Freq: Once | CUTANEOUS | Status: DC

## 2022-01-02 MED ORDER — FENTANYL CITRATE (PF) 100 MCG/2ML IJ SOLN
INTRAMUSCULAR | Status: AC
  Filled 2022-01-02: qty 2

## 2022-01-02 MED ORDER — LABETALOL HCL 5 MG/ML IV SOLN
INTRAVENOUS | Status: AC
  Filled 2022-01-02: qty 4

## 2022-01-02 MED ORDER — CEFAZOLIN SODIUM-DEXTROSE 2-4 GM/100ML-% IV SOLN
2.0000 g | INTRAVENOUS | Status: AC
  Administered 2022-01-02: 2 g via INTRAVENOUS
  Filled 2022-01-02: qty 100

## 2022-01-02 MED ORDER — SODIUM CHLORIDE (PF) 0.9 % IJ SOLN
INTRAMUSCULAR | Status: DC | PRN
  Administered 2022-01-02: 61 mL

## 2022-01-02 MED ORDER — MENTHOL 3 MG MT LOZG: 1.0000 | LOZENGE | OROMUCOSAL | Status: DC | PRN

## 2022-01-02 MED ORDER — PROPOFOL 10 MG/ML IV BOLUS
INTRAVENOUS | Status: AC
  Filled 2022-01-02: qty 20

## 2022-01-02 MED ORDER — METHOCARBAMOL 1000 MG/10ML IJ SOLN: 500.0000 mg | Freq: Four times a day (QID) | INTRAVENOUS | Status: DC | PRN

## 2022-01-02 MED ORDER — ENSURE ENLIVE PO LIQD
237.0000 mL | Freq: Two times a day (BID) | ORAL | Status: DC
  Administered 2022-01-03 – 2022-01-08 (×7): 237 mL via ORAL

## 2022-01-02 MED ORDER — PHENOL 1.4 % MT LIQD: 1.0000 | OROMUCOSAL | Status: DC | PRN

## 2022-01-02 MED ORDER — FENTANYL CITRATE (PF) 100 MCG/2ML IJ SOLN
25.0000 ug | INTRAMUSCULAR | Status: DC | PRN
  Administered 2022-01-02: 50 ug via INTRAVENOUS

## 2022-01-02 MED ORDER — ORAL CARE MOUTH RINSE: 15.0000 mL | Freq: Once | OROMUCOSAL | Status: DC

## 2022-01-02 MED ORDER — ONDANSETRON HCL 4 MG PO TABS: 4.0000 mg | ORAL_TABLET | Freq: Four times a day (QID) | ORAL | Status: DC | PRN

## 2022-01-02 MED ORDER — DOCUSATE SODIUM 100 MG PO CAPS
100.0000 mg | ORAL_CAPSULE | Freq: Two times a day (BID) | ORAL | Status: DC
  Administered 2022-01-02 – 2022-01-08 (×10): 100 mg via ORAL
  Filled 2022-01-02 (×12): qty 1

## 2022-01-02 MED ORDER — ROCURONIUM BROMIDE 10 MG/ML (PF) SYRINGE
PREFILLED_SYRINGE | INTRAVENOUS | Status: DC | PRN
  Administered 2022-01-02: 50 mg via INTRAVENOUS

## 2022-01-02 MED ORDER — LIDOCAINE 2% (20 MG/ML) 5 ML SYRINGE
INTRAMUSCULAR | Status: DC | PRN
  Administered 2022-01-02: 100 mg via INTRAVENOUS

## 2022-01-02 MED ORDER — METOCLOPRAMIDE HCL 5 MG PO TABS: 5.0000 mg | ORAL_TABLET | Freq: Three times a day (TID) | ORAL | Status: DC | PRN

## 2022-01-02 MED ORDER — METHOCARBAMOL 500 MG PO TABS
500.0000 mg | ORAL_TABLET | Freq: Four times a day (QID) | ORAL | Status: DC | PRN
  Administered 2022-01-07: 500 mg via ORAL
  Filled 2022-01-02: qty 1

## 2022-01-02 MED ORDER — METOCLOPRAMIDE HCL 5 MG/ML IJ SOLN: 5.0000 mg | Freq: Three times a day (TID) | INTRAMUSCULAR | Status: DC | PRN

## 2022-01-02 MED ORDER — POVIDONE-IODINE 10 % EX SWAB
2.0000 | Freq: Once | CUTANEOUS | Status: AC
  Administered 2022-01-02: 2 via TOPICAL

## 2022-01-02 MED ORDER — ADULT MULTIVITAMIN W/MINERALS CH
1.0000 | ORAL_TABLET | Freq: Every day | ORAL | Status: DC
  Administered 2022-01-03 – 2022-01-08 (×5): 1 via ORAL
  Filled 2022-01-02 (×6): qty 1

## 2022-01-02 MED ORDER — FENTANYL CITRATE (PF) 250 MCG/5ML IJ SOLN
INTRAMUSCULAR | Status: AC
  Filled 2022-01-02: qty 5

## 2022-01-02 MED ORDER — FENTANYL CITRATE (PF) 100 MCG/2ML IJ SOLN
INTRAMUSCULAR | Status: DC | PRN
  Administered 2022-01-02: 25 ug via INTRAVENOUS
  Administered 2022-01-02: 50 ug via INTRAVENOUS

## 2022-01-02 MED ORDER — HYDROCODONE-ACETAMINOPHEN 7.5-325 MG PO TABS
1.0000 | ORAL_TABLET | ORAL | Status: DC | PRN
  Administered 2022-01-05 – 2022-01-07 (×3): 1 via ORAL
  Filled 2022-01-02 (×3): qty 1

## 2022-01-02 MED ORDER — SENNA 8.6 MG PO TABS
1.0000 | ORAL_TABLET | Freq: Two times a day (BID) | ORAL | Status: DC
  Administered 2022-01-03 – 2022-01-08 (×8): 8.6 mg via ORAL
  Filled 2022-01-02 (×9): qty 1

## 2022-01-02 MED ORDER — BUPIVACAINE-EPINEPHRINE (PF) 0.5% -1:200000 IJ SOLN
INTRAMUSCULAR | Status: DC | PRN
  Administered 2022-01-02: 30 mL via PERINEURAL

## 2022-01-02 MED ORDER — KETOROLAC TROMETHAMINE 30 MG/ML IJ SOLN
INTRAMUSCULAR | Status: AC
  Filled 2022-01-02: qty 1

## 2022-01-02 MED ORDER — CHLORHEXIDINE GLUCONATE 0.12 % MT SOLN: 15.0000 mL | Freq: Once | OROMUCOSAL | Status: DC

## 2022-01-02 MED ORDER — CEFAZOLIN SODIUM-DEXTROSE 2-4 GM/100ML-% IV SOLN
2.0000 g | Freq: Three times a day (TID) | INTRAVENOUS | Status: AC
  Administered 2022-01-03 (×2): 2 g via INTRAVENOUS
  Filled 2022-01-02 (×2): qty 100

## 2022-01-02 MED ORDER — POLYETHYLENE GLYCOL 3350 17 G PO PACK: 17.0000 g | PACK | Freq: Every day | ORAL | Status: DC | PRN

## 2022-01-02 MED ORDER — IRRISEPT - 450ML BOTTLE WITH 0.05% CHG IN STERILE WATER, USP 99.95% OPTIME
TOPICAL | Status: DC | PRN
  Administered 2022-01-02: 450 mL

## 2022-01-02 MED ORDER — ACETAMINOPHEN 500 MG PO TABS
1000.0000 mg | ORAL_TABLET | Freq: Once | ORAL | Status: DC
  Filled 2022-01-02: qty 2

## 2022-01-02 MED ORDER — ACETAMINOPHEN 500 MG PO TABS
500.0000 mg | ORAL_TABLET | Freq: Four times a day (QID) | ORAL | Status: AC
  Administered 2022-01-02 – 2022-01-03 (×4): 500 mg via ORAL
  Filled 2022-01-02 (×4): qty 1

## 2022-01-02 MED ORDER — SODIUM CHLORIDE 0.9 % IR SOLN
Status: DC | PRN
  Administered 2022-01-02: 3000 mL

## 2022-01-02 MED ORDER — HYDROCODONE-ACETAMINOPHEN 5-325 MG PO TABS
1.0000 | ORAL_TABLET | ORAL | Status: DC | PRN
  Administered 2022-01-04: 1 via ORAL
  Filled 2022-01-02: qty 1

## 2022-01-02 MED ORDER — CHLORHEXIDINE GLUCONATE 4 % EX LIQD: 60.0000 mL | Freq: Once | CUTANEOUS | Status: DC

## 2022-01-02 MED ORDER — ACETAMINOPHEN 325 MG PO TABS
650.0000 mg | ORAL_TABLET | Freq: Four times a day (QID) | ORAL | Status: DC | PRN
  Administered 2022-01-03 – 2022-01-08 (×2): 650 mg via ORAL
  Filled 2022-01-02 (×2): qty 2

## 2022-01-02 MED ORDER — BUPIVACAINE-EPINEPHRINE (PF) 0.5% -1:200000 IJ SOLN
INTRAMUSCULAR | Status: AC
  Filled 2022-01-02: qty 30

## 2022-01-02 MED ORDER — LABETALOL HCL 5 MG/ML IV SOLN
5.0000 mg | Freq: Once | INTRAVENOUS | Status: AC
  Administered 2022-01-02: 5 mg via INTRAVENOUS

## 2022-01-02 SURGICAL SUPPLY — 64 items
ADH SKN CLS APL DERMABOND .7 (GAUZE/BANDAGES/DRESSINGS) ×2
ALCOHOL 70% 16 OZ (MISCELLANEOUS) ×1 IMPLANT
APL PRP STRL LF DISP 70% ISPRP (MISCELLANEOUS) ×1
BAG COUNTER SPONGE SURGICOUNT (BAG) ×1 IMPLANT
BAG SPNG CNTER NS LX DISP (BAG) ×1
BLADE CLIPPER SURG (BLADE) IMPLANT
CHLORAPREP W/TINT 26 (MISCELLANEOUS) ×1 IMPLANT
COVER SURGICAL LIGHT HANDLE (MISCELLANEOUS) ×1 IMPLANT
DERMABOND ADVANCED .7 DNX12 (GAUZE/BANDAGES/DRESSINGS) ×2 IMPLANT
DRAPE C-ARM 42X72 X-RAY (DRAPES) ×1 IMPLANT
DRAPE STERI IOBAN 125X83 (DRAPES) ×1 IMPLANT
DRAPE U-SHAPE 47X51 STRL (DRAPES) ×3 IMPLANT
DRSG AQUACEL AG ADV 3.5X10 (GAUZE/BANDAGES/DRESSINGS) ×1 IMPLANT
ELECT BLADE 4.0 EZ CLEAN MEGAD (MISCELLANEOUS) ×1
ELECT PENCIL ROCKER SW 15FT (MISCELLANEOUS) ×1 IMPLANT
ELECT REM PT RETURN 9FT ADLT (ELECTROSURGICAL) ×1
ELECTRODE BLDE 4.0 EZ CLN MEGD (MISCELLANEOUS) ×1 IMPLANT
ELECTRODE REM PT RTRN 9FT ADLT (ELECTROSURGICAL) ×1 IMPLANT
EVACUATOR 1/8 PVC DRAIN (DRAIN) IMPLANT
GLOVE BIO SURGEON STRL SZ8.5 (GLOVE) ×2 IMPLANT
GLOVE BIOGEL M 7.0 STRL (GLOVE) ×1 IMPLANT
GLOVE BIOGEL PI IND STRL 7.5 (GLOVE) ×1 IMPLANT
GLOVE BIOGEL PI IND STRL 8.5 (GLOVE) ×1 IMPLANT
GOWN STRL REUS W/ TWL LRG LVL3 (GOWN DISPOSABLE) ×2 IMPLANT
GOWN STRL REUS W/ TWL XL LVL3 (GOWN DISPOSABLE) ×1 IMPLANT
GOWN STRL REUS W/TWL 2XL LVL3 (GOWN DISPOSABLE) ×1 IMPLANT
GOWN STRL REUS W/TWL LRG LVL3 (GOWN DISPOSABLE) ×2
GOWN STRL REUS W/TWL XL LVL3 (GOWN DISPOSABLE) ×1
HANDPIECE INTERPULSE COAX TIP (DISPOSABLE) ×1
HEAD MODULAR STD 28MM (Orthopedic Implant) IMPLANT
HOOD PEEL AWAY FACE SHEILD DIS (HOOD) ×2 IMPLANT
JET LAVAGE IRRISEPT WOUND (IRRIGATION / IRRIGATOR) ×1
KIT BASIN OR (CUSTOM PROCEDURE TRAY) ×1 IMPLANT
KIT TURNOVER KIT B (KITS) ×1 IMPLANT
LAVAGE JET IRRISEPT WOUND (IRRIGATION / IRRIGATOR) ×1 IMPLANT
MANIFOLD NEPTUNE II (INSTRUMENTS) ×1 IMPLANT
MARKER SKIN DUAL TIP RULER LAB (MISCELLANEOUS) ×2 IMPLANT
NDL SPNL 18GX3.5 QUINCKE PK (NEEDLE) ×1 IMPLANT
NEEDLE SPNL 18GX3.5 QUINCKE PK (NEEDLE) ×1 IMPLANT
NS IRRIG 1000ML POUR BTL (IV SOLUTION) ×1 IMPLANT
PACK TOTAL JOINT (CUSTOM PROCEDURE TRAY) ×1 IMPLANT
PACK UNIVERSAL I (CUSTOM PROCEDURE TRAY) ×1 IMPLANT
PAD ARMBOARD 7.5X6 YLW CONV (MISCELLANEOUS) ×2 IMPLANT
RINGBLOC BI POLAR 28X56MM (Orthopedic Implant) ×1 IMPLANT
SAW OSC TIP CART 19.5X105X1.3 (SAW) ×1 IMPLANT
SEALER BIPOLAR AQUA 6.0 (INSTRUMENTS) IMPLANT
SET HNDPC FAN SPRY TIP SCT (DISPOSABLE) ×1 IMPLANT
SHELL RINGBLOC BI POLR 28X56MM (Orthopedic Implant) IMPLANT
SOL PREP POV-IOD 4OZ 10% (MISCELLANEOUS) ×1 IMPLANT
STEM FEM CMTLS 18X156 133D (Stem) IMPLANT
SUT ETHIBOND NAB CT1 #1 30IN (SUTURE) ×2 IMPLANT
SUT MNCRL AB 3-0 PS2 18 (SUTURE) ×1 IMPLANT
SUT MON AB 2-0 CT1 36 (SUTURE) ×1 IMPLANT
SUT VIC AB 2-0 CT1 27 (SUTURE) ×1
SUT VIC AB 2-0 CT1 TAPERPNT 27 (SUTURE) ×1 IMPLANT
SUT VLOC 180 0 24IN GS25 (SUTURE) ×1 IMPLANT
SYR 50ML LL SCALE MARK (SYRINGE) ×1 IMPLANT
TOWEL GREEN STERILE (TOWEL DISPOSABLE) ×1 IMPLANT
TOWEL GREEN STERILE FF (TOWEL DISPOSABLE) ×1 IMPLANT
TRAY CATH 16FR W/PLASTIC CATH (SET/KITS/TRAYS/PACK) IMPLANT
TRAY FOLEY W/BAG SLVR 16FR (SET/KITS/TRAYS/PACK)
TRAY FOLEY W/BAG SLVR 16FR ST (SET/KITS/TRAYS/PACK) IMPLANT
TUBE SUCT ARGYLE STRL (TUBING) ×1 IMPLANT
WATER STERILE IRR 1000ML POUR (IV SOLUTION) ×3 IMPLANT

## 2022-01-02 NOTE — Progress Notes (Signed)
Initial Nutrition Assessment  DOCUMENTATION CODES:   Not applicable  INTERVENTION:  Once diet resumes, recommend: Regular diet Ensure Enlive po BID, each supplement provides 350 kcal and 20 grams of protein. MVI with minerals daily Request updated weight  NUTRITION DIAGNOSIS:   Increased nutrient needs related to hip fracture, post-op healing as evidenced by estimated needs.  GOAL:   Patient will meet greater than or equal to 90% of their needs  MONITOR:   PO intake, Supplement acceptance, Diet advancement, Labs, Weight trends  REASON FOR ASSESSMENT:   Consult Hip fracture protocol  ASSESSMENT:   Pt admitted with R femoral neck fracture after a fall. PMH significant for chronic afib and HTN.  Plans for R hip repair today.   Pt resting soundly at time of visit. No family present to obtain history from.   Per review of chart, pt was living independently at home. Typically uses a cane or walker for ambulation.  There is limited documentation of weight history within the last year. No current weight on file. Last documented weight was 68.5 kg on 09/28/20. Will request updated weight to review.    Pt would likely benefit from the addition of nutrition supplements once his diet advances postoperatively to optimize post op healing.   Medications and labs reviewed  NUTRITION - FOCUSED PHYSICAL EXAM: Pt resting soundly, deferred to follow up.   Diet Order:   Diet Order             Diet NPO time specified Except for: Sips with Meds  Diet effective now                   EDUCATION NEEDS:   No education needs have been identified at this time  Skin:  Skin Assessment: Reviewed RN Assessment  Last BM:  PTA  Height:   Ht Readings from Last 1 Encounters:  09/28/20 6\' 2"  (1.88 m)    Weight:   Wt Readings from Last 1 Encounters:  09/28/20 68.5 kg    Ideal Body Weight:  86.4 kg  BMI:  There is no height or weight on file to calculate BMI.  Estimated  Nutritional Needs:   Kcal:  1800-2000  Protein:  90-105g  Fluid:  >/=1.8L  Clayborne Dana, RDN, LDN Clinical Nutrition

## 2022-01-02 NOTE — Consult Note (Signed)
Reason for Consult:Right hip fx Referring Physician: Vance Gather Time called: 0730 Time at bedside: [place]   Noah Waters is an 88 y.o. male.  HPI: Osten fell at home and broke his right hip. He was brought to the ED where x-rays confirmed the diagnosis and orthopedic surgery was consulted. He is delirious and cannot meaningfully contribute to history. Daughter is at bedside and says he lives independently with help from family and ambulates with a RW or cane at baseline.  Past Medical History:  Diagnosis Date   Hypertension     Past Surgical History:  Procedure Laterality Date   EYE SURGERY      Family History  Problem Relation Age of Onset   Diabetes type II Daughter    Diabetes type II Daughter     Social History:  reports that he quit smoking about 73 years ago. His smoking use included cigarettes. He has never used smokeless tobacco. He reports that he does not drink alcohol and does not use drugs.  Allergies: No Known Allergies  Medications: I have reviewed the patient's current medications.  Results for orders placed or performed during the hospital encounter of 01/01/22 (from the past 48 hour(s))  Basic metabolic panel     Status: Abnormal   Collection Time: 01/01/22  7:55 PM  Result Value Ref Range   Sodium 137 135 - 145 mmol/L   Potassium 3.2 (L) 3.5 - 5.1 mmol/L   Chloride 102 98 - 111 mmol/L   CO2 26 22 - 32 mmol/L   Glucose, Bld 134 (H) 70 - 99 mg/dL    Comment: Glucose reference range applies only to samples taken after fasting for at least 8 hours.   BUN 12 8 - 23 mg/dL   Creatinine, Ser 0.95 0.61 - 1.24 mg/dL   Calcium 8.5 (L) 8.9 - 10.3 mg/dL   GFR, Estimated >60 >60 mL/min    Comment: (NOTE) Calculated using the CKD-EPI Creatinine Equation (2021)    Anion gap 9 5 - 15    Comment: Performed at [facility] [address] [place], [place] [address]  Protime-INR     Status: None   Collection Time: 01/01/22  7:55 PM  Result Value Ref Range    Prothrombin Time 15.1 11.4 - 15.2 seconds   INR 1.2 0.8 - 1.2    Comment: (NOTE) INR goal varies based on device and disease states. Performed at [facility], [place] [address], [place], [place] [address]   Type and screen [place]     Status: None   Collection Time: 01/01/22  7:55 PM  Result Value Ref Range   ABO/RH(D) O NEG    Antibody Screen NEG    Sample Expiration      01/04/2022,2359 Performed at [facility], [place] [address], [place], [place] [address]   CBC     Status: Abnormal   Collection Time: 01/01/22  7:55 PM  Result Value Ref Range   WBC 6.4 4.0 - 10.5 K/uL   RBC 4.41 4.22 - 5.81 MIL/uL   Hemoglobin 13.6 13.0 - 17.0 g/dL   HCT 40.3 39.0 - 52.0 %   MCV 91.4 80.0 - 100.0 fL   MCH 30.8 26.0 - 34.0 pg   MCHC 33.7 30.0 - 36.0 g/dL   RDW 13.8 11.5 - 15.5 %   Platelets 126 (L) 150 - 400 K/uL    Comment: REPEATED TO VERIFY   nRBC 0.0 0.0 - 0.2 %  Comment: Performed at [facility], [place] [address], [place], [place] [id number]  Basic metabolic panel     Status: Abnormal   Collection Time: 01/02/22  3:48 AM  Result Value Ref Range   Sodium 139 135 - 145 mmol/L   Potassium 3.5 3.5 - 5.1 mmol/L   Chloride 102 98 - 111 mmol/L   CO2 27 22 - 32 mmol/L   Glucose, Bld 129 (H) 70 - 99 mg/dL    Comment: Glucose reference range applies only to samples taken after fasting for at least 8 hours.   BUN 12 8 - 23 mg/dL   Creatinine, Ser 0.94 0.61 - 1.24 mg/dL   Calcium 8.7 (L) 8.9 - 10.3 mg/dL   GFR, Estimated >60 >60 mL/min    Comment: (NOTE) Calculated using the CKD-EPI Creatinine Equation (2021)    Anion gap 10 5 - 15    Comment: Performed at [facility] [address], [place], [place] [address]  CBC     Status: Abnormal   Collection Time: 01/02/22  3:48 AM  Result Value Ref Range   WBC 7.1 4.0 - 10.5 K/uL   RBC 4.31 4.22 - 5.81 MIL/uL   Hemoglobin 13.0 13.0 - 17.0 g/dL   HCT 40.2 39.0 - 52.0 %   MCV 93.3 80.0 - 100.0 fL    MCH 30.2 26.0 - 34.0 pg   MCHC 32.3 30.0 - 36.0 g/dL   RDW 13.9 11.5 - 15.5 %   Platelets 129 (L) 150 - 400 K/uL    Comment: REPEATED TO VERIFY   nRBC 0.0 0.0 - 0.2 %    Comment: Performed at [facility], [place] [address] [place], [place] [address]  Magnesium     Status: None   Collection Time: 01/02/22  3:48 AM  Result Value Ref Range   Magnesium 1.9 1.7 - 2.4 mg/dL    Comment: Performed at [facility] [address] [place], [place] [address]  ABO/Rh     Status: None   Collection Time: 01/02/22  6:30 AM  Result Value Ref Range   ABO/RH(D)      O NEG Performed at [facility] [address], [place], [place] [address]     DG Knee Right Port  Result Date: 01/02/2022 CLINICAL DATA:  Hip fracture EXAM: PORTABLE RIGHT KNEE - 1-2 VIEW COMPARISON:  None Available. FINDINGS: No evidence of fracture, dislocation, or joint effusion. Medial compartment joint space narrowing. Chondrocalcinosis. Soft tissues are unremarkable. IMPRESSION: 1. No acute osseous abnormality of the right knee. 2. Medial compartment joint space narrowing. 3. Chondrocalcinosis. Electronically Signed   By: Davina Poke D.O.   On: 01/02/2022 08:31   DG Pelvis Portable  Result Date: 01/02/2022 CLINICAL DATA:  Hip fracture EXAM: PORTABLE PELVIS 1-2 VIEWS COMPARISON:  CT 01/01/2022 FINDINGS: Patient is rotated. Acute right femoral neck fracture with shortening and external rotation. No dislocation. No additional fractures. Bones appear demineralized. No appreciable soft tissue abnormality. IMPRESSION: Acute right femoral neck fracture. Electronically Signed   By: Davina Poke D.O.   On: 01/02/2022 08:30   Chest Portable 1 View  Result Date: 01/01/2022 CLINICAL DATA:  Preop, right hip fracture EXAM: PORTABLE CHEST 1 VIEW COMPARISON:  None Available. FINDINGS: Lungs are clear.  No pleural effusion or pneumothorax. The heart is normal in size. IMPRESSION: No evidence of acute cardiopulmonary  disease. Electronically Signed   By: Julian Hy M.D.   On: 01/01/2022 22:18  CT Hip Right Wo Contrast  Result Date: 01/01/2022 CLINICAL DATA:  Right hip pain.  Fall EXAM: CT OF THE RIGHT HIP WITHOUT CONTRAST TECHNIQUE: Multidetector CT imaging of the right hip was performed according to the standard protocol. Multiplanar CT image reconstructions were also generated. RADIATION DOSE REDUCTION: This exam was performed according to the departmental dose-optimization program which includes automated exposure control, adjustment of the mA and/or kV according to patient size and/or use of iterative reconstruction technique. COMPARISON:  None Available. FINDINGS: Bones/Joint/Cartilage Acute right femoral neck fracture, partially subcapital in location. There is shortening and external rotation of the femoral shaft. Impaction with varus and anterior apex angulation at the fracture site. No involvement of the intertrochanteric region. Femoroacetabular joint alignment is maintained without dislocation. The right hemipelvis is intact without fracture or diastasis. No lytic or sclerotic bone lesions are seen. Degenerative disc disease of the imaged lower lumbar spine. Ligaments Suboptimally assessed by CT. Muscles and Tendons No acute musculotendinous abnormality by CT. Soft tissues No hematoma within the soft tissues.  Aortoiliac atherosclerosis. IMPRESSION: Acute right femoral neck fracture, as described above. Aortic Atherosclerosis (ICD10-I70.0). Electronically Signed   By: Davina Poke D.O.   On: 01/01/2022 19:12   CT Head Wo Contrast  Result Date: 01/01/2022 CLINICAL DATA:  Status post fall. EXAM: CT HEAD WITHOUT CONTRAST TECHNIQUE: Contiguous axial images were obtained from the base of the skull through the vertex without intravenous contrast. RADIATION DOSE REDUCTION: This exam was performed according to the departmental dose-optimization program which includes automated exposure control, adjustment  of the mA and/or kV according to patient size and/or use of iterative reconstruction technique. COMPARISON:  None Available. FINDINGS: Brain: There is moderate severity cerebral atrophy with widening of the extra-axial spaces and ventricular dilatation. There are areas of decreased attenuation within the white matter tracts of the supratentorial brain, consistent with microvascular disease changes. Vascular: No hyperdense vessel or unexpected calcification. Skull: Normal. Negative for fracture or focal lesion. Sinuses/Orbits: No acute finding. 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 IMPRESSION: 1. No acute intracranial abnormality. 2. Generalized cerebral atrophy with widening of the extra-axial spaces and ventricular dilatation. Electronically Signed   By: Virgina Norfolk M.D.   On: 01/01/2022 19:11   CT Cervical Spine Wo Contrast  Result Date: 01/01/2022 CLINICAL DATA:  Ground level fall at home, right hip pain EXAM: CT CERVICAL SPINE WITHOUT CONTRAST TECHNIQUE: Multidetector CT imaging of the cervical spine was performed without intravenous contrast. Multiplanar CT image reconstructions were also generated. RADIATION DOSE REDUCTION: This exam was performed according to the departmental dose-optimization program which includes automated exposure control, adjustment of the mA and/or kV according to patient size and/or use of iterative reconstruction technique. COMPARISON:  None Available. FINDINGS: Alignment: Alignment is anatomic. Skull base and vertebrae: No acute fracture. No primary bone lesion or focal pathologic process. Soft tissues and spinal canal: No prevertebral fluid or swelling. No visible canal hematoma. Disc levels: There is diffuse multilevel spondylosis and facet hypertrophy. Circumferential disc osteophyte formation is most pronounced from C3-4 through C6-7, with central canal and neural foraminal encroachment greatest at C3-4, C4-5, and C6-7. Upper chest:  Deviation of the trachea to the right by an enlarged heterogeneous thyroid, most consistent with goiter. A non-emergent outpatient thyroid ultrasound could be considered if clinically warranted given patient age. Reference: J Am Coll Radiol. 2015 Feb;12(2): 143-50 The airway is patent.  Lung apices are clear. Other: Reconstructed images demonstrate no additional findings.  IMPRESSION: 1. No evidence of cervical spine fracture. 2. Extensive multilevel spondylosis and facet hypertrophy. Electronically Signed   By: Randa Ngo M.D.   On: 01/01/2022 19:03    Review of Systems  Unable to perform ROS: Mental status change   Blood pressure 125/65, pulse 64, temperature 99.5 F (37.5 C), temperature source Oral, resp. rate 18, SpO2 95 %. Physical Exam Constitutional:      General: He is not in acute distress.    Appearance: He is well-developed. He is not diaphoretic.  HENT:     Head: Normocephalic and atraumatic.  Eyes:     General: No scleral icterus.       Right eye: No discharge.        Left eye: No discharge.     Conjunctiva/sclera: Conjunctivae normal.  Cardiovascular:     Rate and Rhythm: Normal rate and regular rhythm.  Pulmonary:     Effort: Pulmonary effort is normal. No respiratory distress.  Musculoskeletal:     Comments: RLE No traumatic wounds, ecchymosis, or rash  Mod TTP hip  No knee or ankle effusion  Knee stable to varus/ valgus and anterior/posterior stress  Sens DPN, SPN, TN could not assess  Motor EHL, ext, flex, evers could not assess  DP 1+, PT 0, No significant edema  Skin:    General: Skin is warm and dry.  Psychiatric:        Speech: He is noncommunicative.     Assessment/Plan: Right hip fx -- Plan hip hemi today with Dr. Lyla Glassing. Please keep NPO. Multiple medical problems including afib not on anticoagulation and HTN -- per primary service    Lisette Abu, PA-C Orthopedic Surgery [phone] 01/02/2022, 9:17 AM

## 2022-01-02 NOTE — ED Notes (Signed)
ED TO INPATIENT HANDOFF REPORT  ED Nurse Name and Phone #: Lind Covert Name/Age/Gender Noah Waters 88 y.o. male Room/Bed: 038C/038C  Code Status   Code Status: DNR  Home/SNF/Other Home Patient oriented to: self Is this baseline? Yes   Triage Complete: Triage complete  Chief Complaint Fracture of femoral neck, right, closed (HCC) [S72.001A]  Triage Note Pt BIBA for a ground level fall at home. Pt A&O x2 c/o R hip pain with a R ear LC. Denies LOC at scene. Pt lives home alone.  CBG 136   Allergies No Known Allergies  Level of Care/Admitting Diagnosis ED Disposition     ED Disposition  Admit   Condition  --   Comment  Hospital Area: [facility] [[id number]]  Level of Care: Telemetry Cardiac [[id number]]  May admit patient to [facility] or [facility] if equivalent level of care is available:: No  Covid Evaluation: Asymptomatic - no recent exposure (last 10 days) testing not required  Diagnosis: Fracture of femoral neck, right, closed [facility]) [[id number]]  Admitting Physician: Charlsie Quest [[id number]]  Attending Physician: Charlsie Quest [[id number]]  Certification:: I certify this patient will need inpatient services for at least 2 midnights  Estimated Length of Stay: 4          B Medical/Surgery History Past Medical History:  Diagnosis Date   Hypertension    Past Surgical History:  Procedure Laterality Date   EYE SURGERY       A IV Location/Drains/Wounds Patient Lines/Drains/Airways Status     Active Line/Drains/Airways     Name Placement date Placement time Site Days   Peripheral IV 01/01/22 20 G 1" Posterior;Right Forearm 01/01/22  1936  Forearm  1            Intake/Output Last 24 hours No intake or output data in the 24 hours ending 01/02/22 0522  Labs/Imaging Results for orders placed or performed during the hospital encounter of 01/01/22 (from the past 48 hour(s))  Basic metabolic panel     Status: Abnormal   Collection Time:  01/01/22  7:55 PM  Result Value Ref Range   Sodium 137 135 - 145 mmol/L   Potassium 3.2 (L) 3.5 - 5.1 mmol/L   Chloride 102 98 - 111 mmol/L   CO2 26 22 - 32 mmol/L   Glucose, Bld 134 (H) 70 - 99 mg/dL    Comment: Glucose reference range applies only to samples taken after fasting for at least 8 hours.   BUN 12 8 - 23 mg/dL   Creatinine, Ser 2.77 0.61 - 1.24 mg/dL   Calcium 8.5 (L) 8.9 - 10.3 mg/dL   GFR, Estimated >82 >42 mL/min    Comment: (NOTE) Calculated using the CKD-EPI Creatinine Equation (2021)    Anion gap 9 5 - 15    Comment: Performed at [facility], [address], [place], [place] [address]  Protime-INR     Status: None   Collection Time: 01/01/22  7:55 PM  Result Value Ref Range   Prothrombin Time 15.1 11.4 - 15.2 seconds   INR 1.2 0.8 - 1.2    Comment: (NOTE) INR goal varies based on device and disease states. Performed at [facility], [address], [place], [place] [address]   Type and screen [facility]     Status: None   Collection Time: 01/01/22  7:55 PM  Result Value Ref Range   ABO/RH(D) O NEG  Antibody Screen NEG    Sample Expiration      01/04/2022,2359 Performed at [facility], [place] [address], [place], [place] [address]   CBC     Status: Abnormal   Collection Time: 01/01/22  7:55 PM  Result Value Ref Range   WBC 6.4 4.0 - 10.5 K/uL   RBC 4.41 4.22 - 5.81 MIL/uL   Hemoglobin 13.6 13.0 - 17.0 g/dL   HCT 40.3 39.0 - 52.0 %   MCV 91.4 80.0 - 100.0 fL   MCH 30.8 26.0 - 34.0 pg   MCHC 33.7 30.0 - 36.0 g/dL   RDW 13.8 11.5 - 15.5 %   Platelets 126 (L) 150 - 400 K/uL    Comment: REPEATED TO VERIFY   nRBC 0.0 0.0 - 0.2 %    Comment: Performed at [facility], [place] [address], [place], [place] [id number]  Basic metabolic panel     Status: Abnormal   Collection Time: 01/02/22  3:48 AM  Result Value Ref Range   Sodium 139 135 - 145 mmol/L   Potassium 3.5 3.5 - 5.1 mmol/L   Chloride 102 98 - 111 mmol/L    CO2 27 22 - 32 mmol/L   Glucose, Bld 129 (H) 70 - 99 mg/dL    Comment: Glucose reference range applies only to samples taken after fasting for at least 8 hours.   BUN 12 8 - 23 mg/dL   Creatinine, Ser 0.94 0.61 - 1.24 mg/dL   Calcium 8.7 (L) 8.9 - 10.3 mg/dL   GFR, Estimated >60 >60 mL/min    Comment: (NOTE) Calculated using the CKD-EPI Creatinine Equation (2021)    Anion gap 10 5 - 15    Comment: Performed at [facility] [address] [place], [place] [address]  CBC     Status: Abnormal   Collection Time: 01/02/22  3:48 AM  Result Value Ref Range   WBC 7.1 4.0 - 10.5 K/uL   RBC 4.31 4.22 - 5.81 MIL/uL   Hemoglobin 13.0 13.0 - 17.0 g/dL   HCT 40.2 39.0 - 52.0 %   MCV 93.3 80.0 - 100.0 fL   MCH 30.2 26.0 - 34.0 pg   MCHC 32.3 30.0 - 36.0 g/dL   RDW 13.9 11.5 - 15.5 %   Platelets 129 (L) 150 - 400 K/uL    Comment: REPEATED TO VERIFY   nRBC 0.0 0.0 - 0.2 %    Comment: Performed at [facility], [place] [address] [place], [place] [address]  Magnesium     Status: None   Collection Time: 01/02/22  3:48 AM  Result Value Ref Range   Magnesium 1.9 1.7 - 2.4 mg/dL    Comment: Performed at [facility] [address], [place], [place] [address]   Chest Portable 1 View  Result Date: 01/01/2022 CLINICAL DATA:  Preop, right hip fracture EXAM: PORTABLE CHEST 1 VIEW COMPARISON:  None Available. FINDINGS: Lungs are clear.  No pleural effusion or pneumothorax. The heart is normal in size. IMPRESSION: No evidence of acute cardiopulmonary disease. Electronically Signed   By: Julian Hy M.D.   On: 01/01/2022 22:18   CT Hip Right Wo Contrast  Result Date: 01/01/2022 CLINICAL DATA:  Right hip pain.  Fall EXAM: CT OF THE RIGHT HIP WITHOUT CONTRAST TECHNIQUE: Multidetector CT imaging of the right hip was performed according to the standard protocol. Multiplanar CT image reconstructions were also generated. RADIATION DOSE REDUCTION: This exam was  performed according to the  departmental dose-optimization program which includes automated exposure control, adjustment of the mA and/or kV according to patient size and/or use of iterative reconstruction technique. COMPARISON:  None Available. FINDINGS: Bones/Joint/Cartilage Acute right femoral neck fracture, partially subcapital in location. There is shortening and external rotation of the femoral shaft. Impaction with varus and anterior apex angulation at the fracture site. No involvement of the intertrochanteric region. Femoroacetabular joint alignment is maintained without dislocation. The right hemipelvis is intact without fracture or diastasis. No lytic or sclerotic bone lesions are seen. Degenerative disc disease of the imaged lower lumbar spine. Ligaments Suboptimally assessed by CT. Muscles and Tendons No acute musculotendinous abnormality by CT. Soft tissues No hematoma within the soft tissues.  Aortoiliac atherosclerosis. IMPRESSION: Acute right femoral neck fracture, as described above. Aortic Atherosclerosis (ICD10-I70.0). Electronically Signed   By: Davina Poke D.O.   On: 01/01/2022 19:12   CT Head Wo Contrast  Result Date: 01/01/2022 CLINICAL DATA:  Status post fall. EXAM: CT HEAD WITHOUT CONTRAST TECHNIQUE: Contiguous axial images were obtained from the base of the skull through the vertex without intravenous contrast. RADIATION DOSE REDUCTION: This exam was performed according to the departmental dose-optimization program which includes automated exposure control, adjustment of the mA and/or kV according to patient size and/or use of iterative reconstruction technique. COMPARISON:  None Available. FINDINGS: Brain: There is moderate severity cerebral atrophy with widening of the extra-axial spaces and ventricular dilatation. There are areas of decreased attenuation within the white matter tracts of the supratentorial brain, consistent with microvascular disease changes. Vascular: No hyperdense vessel or unexpected  calcification. Skull: Normal. Negative for fracture or focal lesion. Sinuses/Orbits: No acute finding. 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 IMPRESSION: 1. No acute intracranial abnormality. 2. Generalized cerebral atrophy with widening of the extra-axial spaces and ventricular dilatation. Electronically Signed   By: Virgina Norfolk M.D.   On: 01/01/2022 19:11   CT Cervical Spine Wo Contrast  Result Date: 01/01/2022 CLINICAL DATA:  Ground level fall at home, right hip pain EXAM: CT CERVICAL SPINE WITHOUT CONTRAST TECHNIQUE: Multidetector CT imaging of the cervical spine was performed without intravenous contrast. Multiplanar CT image reconstructions were also generated. RADIATION DOSE REDUCTION: This exam was performed according to the departmental dose-optimization program which includes automated exposure control, adjustment of the mA and/or kV according to patient size and/or use of iterative reconstruction technique. COMPARISON:  None Available. FINDINGS: Alignment: Alignment is anatomic. Skull base and vertebrae: No acute fracture. No primary bone lesion or focal pathologic process. Soft tissues and spinal canal: No prevertebral fluid or swelling. No visible canal hematoma. Disc levels: There is diffuse multilevel spondylosis and facet hypertrophy. Circumferential disc osteophyte formation is most pronounced from C3-4 through C6-7, with central canal and neural foraminal encroachment greatest at C3-4, C4-5, and C6-7. Upper chest: Deviation of the trachea to the right by an enlarged heterogeneous thyroid, most consistent with goiter. A non-emergent outpatient thyroid ultrasound could be considered if clinically warranted given patient age. Reference: J Am Coll Radiol. 2015 Feb;12(2): 143-50 The airway is patent.  Lung apices are clear. Other: Reconstructed images demonstrate no additional findings. IMPRESSION: 1. No evidence of cervical spine fracture. 2. Extensive  multilevel spondylosis and facet hypertrophy. Electronically Signed   By: Randa Ngo M.D.   On: 01/01/2022 19:03    Pending Labs Unresulted Labs (From admission, onward)     Start     Ordered   01/01/22  2100  ABO/Rh  Once,   STAT        01/01/22 2100            Vitals/Pain Today's Vitals   01/02/22 0330 01/02/22 0345 01/02/22 0400 01/02/22 0415  BP: (!) 167/83 (!) 152/85 (!) 158/88 136/81  Pulse: 60 69 67 62  Resp:  14 14 20   Temp:      TempSrc:      SpO2: 94% 95% 96% 94%    Isolation Precautions No active isolations  Medications Medications  HYDROmorphone (DILAUDID) injection 1 mg (1 mg Intravenous Given 01/02/22 0014)  HYDROcodone-acetaminophen (NORCO/VICODIN) 5-325 MG per tablet 1-2 tablet (has no administration in time range)  senna-docusate (Senokot-S) tablet 1 tablet (has no administration in time range)  ondansetron (ZOFRAN) injection 4 mg (has no administration in time range)  potassium chloride (KLOR-CON) packet 60 mEq (60 mEq Oral Not Given 01/01/22 2325)  fentaNYL (SUBLIMAZE) injection 25 mcg (25 mcg Intravenous Given 01/01/22 1937)  morphine (PF) 4 MG/ML injection 4 mg (4 mg Intravenous Given 01/01/22 2027)  haloperidol lactate (HALDOL) injection 1 mg (1 mg Intravenous Given 01/01/22 2127)  haloperidol lactate (HALDOL) injection 2 mg (2 mg Intravenous Given 01/01/22 2230)    Mobility non-ambulatory Low fall risk   Focused Assessments    R Recommendations: See Admitting Provider Note  Report given to:   Additional Notes:

## 2022-01-02 NOTE — Anesthesia Procedure Notes (Signed)
Procedure Name: Intubation Date/Time: 01/02/2022 5:53 PM  Performed by: Moshe Salisbury, CRNAPre-anesthesia Checklist: Patient identified, Emergency Drugs available, Suction available and Patient being monitored Patient Re-evaluated:Patient Re-evaluated prior to induction Oxygen Delivery Method: Circle System Utilized Preoxygenation: Pre-oxygenation with 100% oxygen Induction Type: IV induction Ventilation: Mask ventilation without difficulty Laryngoscope Size: Mac and 4 Grade View: Grade I Tube type: Oral Tube size: 8.0 mm Number of attempts: 1 Airway Equipment and Method: Stylet Placement Confirmation: ETT inserted through vocal cords under direct vision, positive ETCO2 and breath sounds checked- equal and bilateral Secured at: 24 cm Tube secured with: Tape Dental Injury: Teeth and Oropharynx as per pre-operative assessment

## 2022-01-02 NOTE — Plan of Care (Signed)
  Problem: Clinical Measurements: Goal: Will remain free from infection Outcome: Progressing   Problem: Coping: Goal: Level of anxiety will decrease Outcome: Progressing   Problem: Safety: Goal: Ability to remain free from injury will improve Outcome: Progressing   

## 2022-01-02 NOTE — Plan of Care (Signed)
  Problem: Clinical Measurements: Goal: Will remain free from infection Outcome: Progressing   Problem: Pain Managment: Goal: General experience of comfort will improve Outcome: Progressing   Problem: Safety: Goal: Ability to remain free from injury will improve Outcome: Progressing   

## 2022-01-02 NOTE — Progress Notes (Addendum)
Patient arrived in short stay, disoriented, responds to voice; patient has mitts on his hands, for his safety; VS stable;  no family at bedside. Patient unable to provide any information; Tylenol was hold because patient is unable to have anything by mouth at this time. Will continue to monitor.

## 2022-01-02 NOTE — Anesthesia Preprocedure Evaluation (Addendum)
Anesthesia Evaluation    Reviewed: Allergy & Precautions, Patient's Chart, lab work & pertinent test results  History of Anesthesia Complications Negative for: history of anesthetic complications  Airway        Dental   Pulmonary former smoker          Cardiovascular hypertension, Pt. on medications + dysrhythmias Atrial Fibrillation      Neuro/Psych negative neurological ROS  negative psych ROS   GI/Hepatic negative GI ROS, Neg liver ROS,,,  Endo/Other  negative endocrine ROS    Renal/GU negative Renal ROS  negative genitourinary   Musculoskeletal Fracture of femoral neck, right, closed   Abdominal   Peds  Hematology negative hematology ROS (+)   Anesthesia Other Findings Day of surgery medications reviewed with patient.  Reproductive/Obstetrics negative OB ROS                             Anesthesia Physical Anesthesia Plan  ASA: 2  Anesthesia Plan:    Post-op Pain Management:    Induction:   PONV Risk Score and Plan:   Airway Management Planned:   Additional Equipment:   Intra-op Plan:   Post-operative Plan:   Informed Consent:   Plan Discussed with:   Anesthesia Plan Comments:        Anesthesia Quick Evaluation

## 2022-01-02 NOTE — Op Note (Signed)
OPERATIVE REPORT  SURGEON: Rod Can, MD   ASSISTANT: Larene Pickett, PA-C  PREOPERATIVE DIAGNOSIS: Displaced Right femoral neck fracture.   POSTOPERATIVE DIAGNOSIS: Displaced Right femoral neck fracture.   PROCEDURE: Right hip hemiarthroplasty, anterior approach.   IMPLANTS: Biomet Taperloc Complete Reduced Distal stem, size 18 x 127mm, high offset, with a 28+0 mm metal head ball and a 56 mm bipolar head ball.  ANESTHESIA:  General  ANTIBIOTICS: 2g ancef.  ESTIMATED BLOOD LOSS: 123mL    DRAINS: None.  COMPLICATIONS: None   CONDITION: PACU - hemodynamically stable.   BRIEF CLINICAL NOTE: Noah Waters is a 88 y.o. male with a displaced Right femoral neck fracture. The patient was admitted to the hospitalist service and underwent perioperative risk stratification and medical optimization. The risks, benefits, and alternatives to hemiarthroplasty were explained, and the patient elected to proceed.  PROCEDURE IN DETAIL: The patient was taken to the operating room and general anesthesia was induced on the hospital bed.  The patient was then positioned on the Hana table.  All bony prominences were well padded.  The hip was prepped and draped in the normal sterile surgical fashion.  A time-out was called verifying side and site of surgery. Antibiotics were given within 60 minutes of beginning the procedure.   Bikini incision was made, and the direct anterior approach to the hip was performed through the Hueter interval.  Superficial dissection was carried out lateral to the ASIS. Lateral femoral circumflex vessels were treated with the Auqumantys. The anterior capsule was exposed and an inverted T capsulotomy was made. Fracture hematoma was encountered and evacuated. The patient was found to have a comminuted Right subcapital femoral neck fracture.  Inferior pubofemoral ligament was released subperiosteally to the lesser trochanter. I freshened the femoral neck cut with a saw.  I removed the  femoral neck fragment.  A corkscrew was placed into the head and the head was removed.  This was passed to the back table and was measured.   Acetabular exposure was achieved.  I examined the articular cartilage which was intact.  The labrum was intact. A 56 mm trial head was placed and found to have excellent fit.   I then gained femoral exposure taking care to protect the abductors and greater trochanter.  The superior capsule was incised longitudinally, staying lateral to the posterior border of the femoral neck. External rotation, extension, and adduction were applied.  A cookie cutter was used to enter the femoral canal, and then the femoral canal finder was used to confirm location.  I then sequentially broached up to a size 18.  Calcar planer was used on the femoral neck remnant.  I placed a high offset neck and a trial bipolar construct. The hip was reduced.  Leg lengths were checked fluoroscopically.  The hip was dislocated and trial components were removed.  I placed the real stem followed by the real bipolar articulation.  A single reduction maneuver was performed and the hip was reduced.  Fluoroscopy was used to confirm component position and leg lengths.  At 90 degrees of external rotation and extension, the hip was stable to an anterior directed force.   The wound was copiously irrigated with Irrisept solution and normal saline using pulse lavage.  Marcaine solution was injected into the periarticular soft tissue.  The wound was closed in layers using #1 V-Loc for the fascia, 2-0 Vicryl for the subcutaneous fat, 2-0 Monocryl for the deep dermal layer, and staples + Dermabond for the skin.  Once the glue was fully dried, an Aquacell Ag dressing was applied.  The patient was then awakened from anesthesia and transported to the recovery room in stable condition.  Sponge, needle, and instrument counts were correct at the end of the case x2.  The patient tolerated the procedure well and there were no  known complications.  Please note that a surgical assistant was a medical necessity for this procedure to perform it in a safe and expeditious manner. Assistant was necessary to provide appropriate retraction of vital neurovascular structures, to prevent femoral fracture, and to allow for anatomic placement of the prosthesis.

## 2022-01-02 NOTE — ED Notes (Signed)
Patient restless, trying to get OOB, pulling at lines unable to redirect. MD made aware

## 2022-01-02 NOTE — Anesthesia Procedure Notes (Signed)
Anesthesia Regional Block: Femoral nerve block   Pre-Anesthetic Checklist: , timeout performed,  Correct Patient, Correct Site, Correct Laterality,  Correct Procedure, Correct Position, site marked,  Risks and benefits discussed,  Pre-op evaluation,  At surgeon's request and post-op pain management  Laterality: Right  Prep: Maximum Sterile Barrier Precautions used, chloraprep       Needles:  Injection technique: Single-shot  Needle Type: Echogenic Stimulator Needle     Needle Length: 9cm  Needle Gauge: 22     Additional Needles:   Procedures:,,,, ultrasound used (permanent image in chart),,    Narrative:  Start time: 01/02/2022 10:29 AM End time: 01/02/2022 10:32 AM Injection made incrementally with aspirations every 5 mL.  Performed by: Personally  Anesthesiologist: Brennan Bailey, MD  Additional Notes: Risks, benefits, and alternative discussed. Patient gave consent for procedure. Patient prepped and draped in sterile fashion. Sedation administered, patient remains easily responsive to voice. Relevant anatomy identified with ultrasound guidance. Local anesthetic given in 5cc increments with no signs or symptoms of intravascular injection. No pain or paraesthesias with injection. Patient monitored throughout procedure with signs of LAST or immediate complications. Tolerated well. Ultrasound image placed in chart.  Tawny Asal, MD

## 2022-01-02 NOTE — ED Notes (Signed)
Patient restless, pulling at lines, trying to get OOB unable to redirect. MD made aware.

## 2022-01-02 NOTE — Anesthesia Preprocedure Evaluation (Signed)
Anesthesia Evaluation  Patient identified by MRN, date of birth, ID band Patient awake    Reviewed: Allergy & Precautions, NPO status , Patient's Chart, lab work & pertinent test results  History of Anesthesia Complications Negative for: history of anesthetic complications  Airway Mallampati: Unable to assess       Dental  (+) Poor Dentition, Missing, Loose   Pulmonary former smoker   + rhonchi        Cardiovascular hypertension, Pt. on medications Normal cardiovascular exam+ dysrhythmias Atrial Fibrillation      Neuro/Psych negative neurological ROS  negative psych ROS   GI/Hepatic negative GI ROS, Neg liver ROS,,,  Endo/Other  negative endocrine ROS    Renal/GU negative Renal ROS  negative genitourinary   Musculoskeletal Fracture of femoral neck, right, closed   Abdominal Normal abdominal exam  (+)   Peds  Hematology negative hematology ROS (+)   Anesthesia Other Findings Day of surgery medications reviewed with patient.  Reproductive/Obstetrics negative OB ROS                             Anesthesia Physical Anesthesia Plan  ASA: 2  Anesthesia Plan: General   Post-op Pain Management:    Induction: Intravenous  PONV Risk Score and Plan: 2 and Treatment may vary due to age or medical condition  Airway Management Planned: Oral ETT  Additional Equipment: None  Intra-op Plan:   Post-operative Plan: Extubation in OR  Informed Consent: I have reviewed the patients History and Physical, chart, labs and discussed the procedure including the risks, benefits and alternatives for the proposed anesthesia with the patient or authorized representative who has indicated his/her understanding and acceptance.     Dental advisory given  Plan Discussed with: CRNA  Anesthesia Plan Comments:        Anesthesia Quick Evaluation

## 2022-01-02 NOTE — Anesthesia Postprocedure Evaluation (Signed)
Anesthesia Post Note  Patient: Noah Waters  Procedure(s) Performed: ANTERIOR APPROACH HEMI HIP ARTHROPLASTY (Right)     Patient location during evaluation: PACU Anesthesia Type: General Level of consciousness: sedated Pain management: pain level controlled Vital Signs Assessment: post-procedure vital signs reviewed and stable Respiratory status: spontaneous breathing and respiratory function stable Cardiovascular status: stable Postop Assessment: no apparent nausea or vomiting Anesthetic complications: no   No notable events documented.  Last Vitals:  Vitals:   01/02/22 2108 01/02/22 2124  BP: 132/75 120/65  Pulse: 68 65  Resp: 15   Temp: 37.3 C 37.8 C  SpO2: 95% 96%    Last Pain:  Vitals:   01/02/22 2124  TempSrc: Axillary                 Alexx Mcburney DANIEL

## 2022-01-02 NOTE — Interval H&P Note (Signed)
History and Physical Interval Note:  01/02/2022 4:55 PM  Regis Bill  has presented today for surgery, with the diagnosis of right femoral neck fracture.  The various methods of treatment have been discussed with the patient and family. After consideration of risks, benefits and other options for treatment, the patient has consented to  Procedure(s): ANTERIOR APPROACH HEMI HIP ARTHROPLASTY (Right) as a surgical intervention.  The patient's history has been reviewed, patient examined, no change in status, stable for surgery.  I have reviewed the patient's chart and labs.  Questions were answered to the patient's satisfaction.    The risks, benefits, and alternatives were discussed with the patient. There are risks associated with the surgery including, but not limited to, problems with anesthesia (death), infection, instability (giving out of the joint), dislocation, differences in leg length/angulation/rotation, fracture of bones, loosening or failure of implants, hematoma (blood accumulation) which may require surgical drainage, blood clots, pulmonary embolism, nerve injury (foot drop and lateral thigh numbness), and blood vessel injury. The patient understands these risks and elects to proceed.    Hilton Cork Lary Eckardt

## 2022-01-02 NOTE — Discharge Instructions (Signed)
 Dr. Brian Swinteck Joint Replacement Specialist [facility] [address] , [place] [address] [phone]   HIP REPLACEMENT POSTOPERATIVE DIRECTIONS    Hip Rehabilitation, Guidelines Following Surgery   WEIGHT BEARING Weight bearing as tolerated with assist device (walker, cane, etc) as directed, use it as long as suggested by your surgeon or therapist, typically at least 4-6 weeks.  The results of a hip operation are greatly improved after range of motion and muscle strengthening exercises. Follow all safety measures which are given to protect your hip. If any of these exercises cause increased pain or swelling in your joint, decrease the amount until you are comfortable again. Then slowly increase the exercises. Call your caregiver if you have problems or questions.   HOME CARE INSTRUCTIONS  Most of the following instructions are designed to prevent the dislocation of your new hip.  Remove items at home which could result in a fall. This includes throw rugs or furniture in walking pathways.  Continue medications as instructed at time of discharge. You may have some home medications which will be placed on hold until you complete the course of blood thinner medication. You may start showering once you are discharged home. Do not remove your dressing. Do not put on socks or shoes without following the instructions of your caregivers.   Sit on chairs with arms. Use the chair arms to help push yourself up when arising.  Arrange for the use of a toilet seat elevator so you are not sitting low.  Walk with walker as instructed.  You may resume a sexual relationship in one month or when given the OK by your caregiver.  Use walker as long as suggested by your caregivers.  You may put full weight on your legs and walk as much as is comfortable. Avoid periods of inactivity such as sitting longer than an hour when not asleep. This helps prevent blood clots.   You may return to work once you are cleared by your surgeon.  Do not drive a car for 6 weeks or until released by your surgeon.  Do not drive while taking narcotics.  Wear elastic stockings for two weeks following surgery during the day but you may remove then at night.  Make sure you keep all of your appointments after your operation with all of your doctors and caregivers. You should call the office at the above phone number and make an appointment for approximately two weeks after the date of your surgery. Please pick up a stool softener and laxative for home use as long as you are requiring pain medications. ICE to the affected hip every three hours for 30 minutes at a time and then as needed for pain and swelling. Continue to use ice on the hip for pain and swelling from surgery. You may notice swelling that will progress down to the foot and ankle.  This is normal after surgery.  Elevate the leg when you are not up walking on it.   It is important for you to complete the blood thinner medication as prescribed by your doctor. Continue to use the breathing machine which will help keep your temperature down.  It is common for your temperature to cycle up and down following surgery, especially at night when you are not up moving around and exerting yourself.  The breathing machine keeps your lungs expanded and your temperature down.  RANGE OF MOTION AND STRENGTHENING EXERCISES  These exercises are designed to help you keep   full movement of your hip joint. Follow your caregiver's or physical therapist's instructions. Perform all exercises about fifteen times, three times per day or as directed. Exercise both hips, even if you have had only one joint replacement. These exercises can be done on a training (exercise) mat, on the floor, on a table or on a bed. Use whatever works the best and is most comfortable for you. Use music or television while you are exercising so that the exercises are a pleasant  break in your day. This will make your life better with the exercises acting as a break in routine you can look forward to.  Lying on your back, slowly slide your foot toward your buttocks, raising your knee up off the floor. Then slowly slide your foot back down until your leg is straight again.  Lying on your back spread your legs as far apart as you can without causing discomfort.  Lying on your side, raise your upper leg and foot straight up from the floor as far as is comfortable. Slowly lower the leg and repeat.  Lying on your back, tighten up the muscle in the front of your thigh (quadriceps muscles). You can do this by keeping your leg straight and trying to raise your heel off the floor. This helps strengthen the largest muscle supporting your knee.  Lying on your back, tighten up the muscles of your buttocks both with the legs straight and with the knee bent at a comfortable angle while keeping your heel on the floor.   SKILLED REHAB INSTRUCTIONS: If the patient is transferred to a skilled rehab facility following release from the hospital, a list of the current medications will be sent to the facility for the patient to continue.  When discharged from the skilled rehab facility, please have the facility set up the patient's Home Health Physical Therapy prior to being released. Also, the skilled facility will be responsible for providing the patient with their medications at time of release from the facility to include their pain medication and their blood thinner medication. If the patient is still at the rehab facility at time of the two week follow up appointment, the skilled rehab facility will also need to assist the patient in arranging follow up appointment in our office and any transportation needs.  POST-OPERATIVE OPIOID TAPER INSTRUCTIONS: It is important to wean off of your opioid medication as soon as possible. If you do not need pain medication after your surgery it is ok to stop  day one. Opioids include: Codeine, Hydrocodone(Norco, Vicodin), Oxycodone(Percocet, oxycontin) and hydromorphone amongst others.  Long term and even short term use of opiods can cause: Increased pain response Dependence Constipation Depression Respiratory depression And more.  Withdrawal symptoms can include Flu like symptoms Nausea, vomiting And more Techniques to manage these symptoms Hydrate well Eat regular healthy meals Stay active Use relaxation techniques(deep breathing, meditating, yoga) Do Not substitute Alcohol to help with tapering If you have been on opioids for less than two weeks and do not have pain than it is ok to stop all together.  Plan to wean off of opioids This plan should start within one week post op of your joint replacement. Maintain the same interval or time between taking each dose and first decrease the dose.  Cut the total daily intake of opioids by one tablet each day Next start to increase the time between doses. The last dose that should be eliminated is the evening dose.    MAKE SURE   YOU:  Understand these instructions.  Will watch your condition.  Will get help right away if you are not doing well or get worse.  Pick up stool softner and laxative for home use following surgery while on pain medications. Do not remove your dressing. The dressing is waterproof--it is OK to take showers. Continue to use ice for pain and swelling after surgery. Do not use any lotions or creams on the incision until instructed by your surgeon. Total Hip Protocol.  

## 2022-01-02 NOTE — H&P (View-Only) (Signed)
Reason for Consult:Right hip fx Referring Physician: Vance Gather Time called: 0730 Time at bedside: [place]   Noah Waters is an 88 y.o. male.  HPI: Osten fell at home and broke his right hip. He was brought to the ED where x-rays confirmed the diagnosis and orthopedic surgery was consulted. He is delirious and cannot meaningfully contribute to history. Daughter is at bedside and says he lives independently with help from family and ambulates with a RW or cane at baseline.  Past Medical History:  Diagnosis Date   Hypertension     Past Surgical History:  Procedure Laterality Date   EYE SURGERY      Family History  Problem Relation Age of Onset   Diabetes type II Daughter    Diabetes type II Daughter     Social History:  reports that he quit smoking about 73 years ago. His smoking use included cigarettes. He has never used smokeless tobacco. He reports that he does not drink alcohol and does not use drugs.  Allergies: No Known Allergies  Medications: I have reviewed the patient's current medications.  Results for orders placed or performed during the hospital encounter of 01/01/22 (from the past 48 hour(s))  Basic metabolic panel     Status: Abnormal   Collection Time: 01/01/22  7:55 PM  Result Value Ref Range   Sodium 137 135 - 145 mmol/L   Potassium 3.2 (L) 3.5 - 5.1 mmol/L   Chloride 102 98 - 111 mmol/L   CO2 26 22 - 32 mmol/L   Glucose, Bld 134 (H) 70 - 99 mg/dL    Comment: Glucose reference range applies only to samples taken after fasting for at least 8 hours.   BUN 12 8 - 23 mg/dL   Creatinine, Ser 0.95 0.61 - 1.24 mg/dL   Calcium 8.5 (L) 8.9 - 10.3 mg/dL   GFR, Estimated >60 >60 mL/min    Comment: (NOTE) Calculated using the CKD-EPI Creatinine Equation (2021)    Anion gap 9 5 - 15    Comment: Performed at [facility] [address] [place], [place] [address]  Protime-INR     Status: None   Collection Time: 01/01/22  7:55 PM  Result Value Ref Range    Prothrombin Time 15.1 11.4 - 15.2 seconds   INR 1.2 0.8 - 1.2    Comment: (NOTE) INR goal varies based on device and disease states. Performed at [facility], [place] [address], [place], [place] [address]   Type and screen [place]     Status: None   Collection Time: 01/01/22  7:55 PM  Result Value Ref Range   ABO/RH(D) O NEG    Antibody Screen NEG    Sample Expiration      01/04/2022,2359 Performed at [facility], [place] [address], [place], [place] [address]   CBC     Status: Abnormal   Collection Time: 01/01/22  7:55 PM  Result Value Ref Range   WBC 6.4 4.0 - 10.5 K/uL   RBC 4.41 4.22 - 5.81 MIL/uL   Hemoglobin 13.6 13.0 - 17.0 g/dL   HCT 40.3 39.0 - 52.0 %   MCV 91.4 80.0 - 100.0 fL   MCH 30.8 26.0 - 34.0 pg   MCHC 33.7 30.0 - 36.0 g/dL   RDW 13.8 11.5 - 15.5 %   Platelets 126 (L) 150 - 400 K/uL    Comment: REPEATED TO VERIFY   nRBC 0.0 0.0 - 0.2 %  Comment: Performed at [facility], [place] [address], [place], [place] [id number]  Basic metabolic panel     Status: Abnormal   Collection Time: 01/02/22  3:48 AM  Result Value Ref Range   Sodium 139 135 - 145 mmol/L   Potassium 3.5 3.5 - 5.1 mmol/L   Chloride 102 98 - 111 mmol/L   CO2 27 22 - 32 mmol/L   Glucose, Bld 129 (H) 70 - 99 mg/dL    Comment: Glucose reference range applies only to samples taken after fasting for at least 8 hours.   BUN 12 8 - 23 mg/dL   Creatinine, Ser 0.94 0.61 - 1.24 mg/dL   Calcium 8.7 (L) 8.9 - 10.3 mg/dL   GFR, Estimated >60 >60 mL/min    Comment: (NOTE) Calculated using the CKD-EPI Creatinine Equation (2021)    Anion gap 10 5 - 15    Comment: Performed at [facility] [address], [place], [place] [address]  CBC     Status: Abnormal   Collection Time: 01/02/22  3:48 AM  Result Value Ref Range   WBC 7.1 4.0 - 10.5 K/uL   RBC 4.31 4.22 - 5.81 MIL/uL   Hemoglobin 13.0 13.0 - 17.0 g/dL   HCT 40.2 39.0 - 52.0 %   MCV 93.3 80.0 - 100.0 fL    MCH 30.2 26.0 - 34.0 pg   MCHC 32.3 30.0 - 36.0 g/dL   RDW 13.9 11.5 - 15.5 %   Platelets 129 (L) 150 - 400 K/uL    Comment: REPEATED TO VERIFY   nRBC 0.0 0.0 - 0.2 %    Comment: Performed at [facility], [place] [address] [place], [place] [address]  Magnesium     Status: None   Collection Time: 01/02/22  3:48 AM  Result Value Ref Range   Magnesium 1.9 1.7 - 2.4 mg/dL    Comment: Performed at [facility] [address] [place], [place] [address]  ABO/Rh     Status: None   Collection Time: 01/02/22  6:30 AM  Result Value Ref Range   ABO/RH(D)      O NEG Performed at [facility] [address], [place],  [address]     DG Knee Right Port  Result Date: 01/02/2022 CLINICAL DATA:  Hip fracture EXAM: PORTABLE RIGHT KNEE - 1-2 VIEW COMPARISON:  None Available. FINDINGS: No evidence of fracture, dislocation, or joint effusion. Medial compartment joint space narrowing. Chondrocalcinosis. Soft tissues are unremarkable. IMPRESSION: 1. No acute osseous abnormality of the right knee. 2. Medial compartment joint space narrowing. 3. Chondrocalcinosis. Electronically Signed   By: Davina Poke D.O.   On: 01/02/2022 08:31   DG Pelvis Portable  Result Date: 01/02/2022 CLINICAL DATA:  Hip fracture EXAM: PORTABLE PELVIS 1-2 VIEWS COMPARISON:  CT 01/01/2022 FINDINGS: Patient is rotated. Acute right femoral neck fracture with shortening and external rotation. No dislocation. No additional fractures. Bones appear demineralized. No appreciable soft tissue abnormality. IMPRESSION: Acute right femoral neck fracture. Electronically Signed   By: Davina Poke D.O.   On: 01/02/2022 08:30   Chest Portable 1 View  Result Date: 01/01/2022 CLINICAL DATA:  Preop, right hip fracture EXAM: PORTABLE CHEST 1 VIEW COMPARISON:  None Available. FINDINGS: Lungs are clear.  No pleural effusion or pneumothorax. The heart is normal in size. IMPRESSION: No evidence of acute cardiopulmonary  disease. Electronically Signed   By: Julian Hy M.D.   On: 01/01/2022 22:18  CT Hip Right Wo Contrast  Result Date: 01/01/2022 CLINICAL DATA:  Right hip pain.  Fall EXAM: CT OF THE RIGHT HIP WITHOUT CONTRAST TECHNIQUE: Multidetector CT imaging of the right hip was performed according to the standard protocol. Multiplanar CT image reconstructions were also generated. RADIATION DOSE REDUCTION: This exam was performed according to the departmental dose-optimization program which includes automated exposure control, adjustment of the mA and/or kV according to patient size and/or use of iterative reconstruction technique. COMPARISON:  None Available. FINDINGS: Bones/Joint/Cartilage Acute right femoral neck fracture, partially subcapital in location. There is shortening and external rotation of the femoral shaft. Impaction with varus and anterior apex angulation at the fracture site. No involvement of the intertrochanteric region. Femoroacetabular joint alignment is maintained without dislocation. The right hemipelvis is intact without fracture or diastasis. No lytic or sclerotic bone lesions are seen. Degenerative disc disease of the imaged lower lumbar spine. Ligaments Suboptimally assessed by CT. Muscles and Tendons No acute musculotendinous abnormality by CT. Soft tissues No hematoma within the soft tissues.  Aortoiliac atherosclerosis. IMPRESSION: Acute right femoral neck fracture, as described above. Aortic Atherosclerosis (ICD10-I70.0). Electronically Signed   By: Davina Poke D.O.   On: 01/01/2022 19:12   CT Head Wo Contrast  Result Date: 01/01/2022 CLINICAL DATA:  Status post fall. EXAM: CT HEAD WITHOUT CONTRAST TECHNIQUE: Contiguous axial images were obtained from the base of the skull through the vertex without intravenous contrast. RADIATION DOSE REDUCTION: This exam was performed according to the departmental dose-optimization program which includes automated exposure control, adjustment  of the mA and/or kV according to patient size and/or use of iterative reconstruction technique. COMPARISON:  None Available. FINDINGS: Brain: There is moderate severity cerebral atrophy with widening of the extra-axial spaces and ventricular dilatation. There are areas of decreased attenuation within the white matter tracts of the supratentorial brain, consistent with microvascular disease changes. Vascular: No hyperdense vessel or unexpected calcification. Skull: Normal. Negative for fracture or focal lesion. Sinuses/Orbits: No acute finding. 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 IMPRESSION: 1. No acute intracranial abnormality. 2. Generalized cerebral atrophy with widening of the extra-axial spaces and ventricular dilatation. Electronically Signed   By: Virgina Norfolk M.D.   On: 01/01/2022 19:11   CT Cervical Spine Wo Contrast  Result Date: 01/01/2022 CLINICAL DATA:  Ground level fall at home, right hip pain EXAM: CT CERVICAL SPINE WITHOUT CONTRAST TECHNIQUE: Multidetector CT imaging of the cervical spine was performed without intravenous contrast. Multiplanar CT image reconstructions were also generated. RADIATION DOSE REDUCTION: This exam was performed according to the departmental dose-optimization program which includes automated exposure control, adjustment of the mA and/or kV according to patient size and/or use of iterative reconstruction technique. COMPARISON:  None Available. FINDINGS: Alignment: Alignment is anatomic. Skull base and vertebrae: No acute fracture. No primary bone lesion or focal pathologic process. Soft tissues and spinal canal: No prevertebral fluid or swelling. No visible canal hematoma. Disc levels: There is diffuse multilevel spondylosis and facet hypertrophy. Circumferential disc osteophyte formation is most pronounced from C3-4 through C6-7, with central canal and neural foraminal encroachment greatest at C3-4, C4-5, and C6-7. Upper chest:  Deviation of the trachea to the right by an enlarged heterogeneous thyroid, most consistent with goiter. A non-emergent outpatient thyroid ultrasound could be considered if clinically warranted given patient age. Reference: J Am Coll Radiol. 2015 Feb;12(2): 143-50 The airway is patent.  Lung apices are clear. Other: Reconstructed images demonstrate no additional findings.  IMPRESSION: 1. No evidence of cervical spine fracture. 2. Extensive multilevel spondylosis and facet hypertrophy. Electronically Signed   By: Randa Ngo M.D.   On: 01/01/2022 19:03    Review of Systems  Unable to perform ROS: Mental status change   Blood pressure 125/65, pulse 64, temperature 99.5 F (37.5 C), temperature source Oral, resp. rate 18, SpO2 95 %. Physical Exam Constitutional:      General: He is not in acute distress.    Appearance: He is well-developed. He is not diaphoretic.  HENT:     Head: Normocephalic and atraumatic.  Eyes:     General: No scleral icterus.       Right eye: No discharge.        Left eye: No discharge.     Conjunctiva/sclera: Conjunctivae normal.  Cardiovascular:     Rate and Rhythm: Normal rate and regular rhythm.  Pulmonary:     Effort: Pulmonary effort is normal. No respiratory distress.  Musculoskeletal:     Comments: RLE No traumatic wounds, ecchymosis, or rash  Mod TTP hip  No knee or ankle effusion  Knee stable to varus/ valgus and anterior/posterior stress  Sens DPN, SPN, TN could not assess  Motor EHL, ext, flex, evers could not assess  DP 1+, PT 0, No significant edema  Skin:    General: Skin is warm and dry.  Psychiatric:        Speech: He is noncommunicative.     Assessment/Plan: Right hip fx -- Plan hip hemi today with Dr. Lyla Glassing. Please keep NPO. Multiple medical problems including afib not on anticoagulation and HTN -- per primary service    Lisette Abu, PA-C Orthopedic Surgery [phone] 01/02/2022, 9:17 AM

## 2022-01-02 NOTE — Progress Notes (Signed)
Patient has arrived to unit from recovery. Able to respond with the word yes, and has not been able to say much other than that at this time. Dressing clean dry and intact. Able to follow minimal commands. Daughter at bedside.

## 2022-01-02 NOTE — Transfer of Care (Signed)
Immediate Anesthesia Transfer of Care Note  Patient: Noah Waters  Procedure(s) Performed: ANTERIOR APPROACH HEMI HIP ARTHROPLASTY (Right)  Patient Location: PACU  Anesthesia Type:General  Level of Consciousness: drowsy and confused  Airway & Oxygen Therapy: Patient Spontanous Breathing and Patient connected to nasal cannula oxygen  Post-op Assessment: Report given to RN and Post -op Vital signs reviewed and stable  Post vital signs: Reviewed and stable  Last Vitals:  Vitals Value Taken Time  BP    Temp    Pulse    Resp    SpO2      Last Pain:  Vitals:   01/02/22 1606  TempSrc: Axillary         Complications: No notable events documented.

## 2022-01-02 NOTE — Progress Notes (Addendum)
Progress Note  Patient: Noah Waters DOB: 06-15-1924  DOA: 01/01/2022  DOS: 01/02/2022    Brief hospital course: Noah Waters is a 88 y.o. male with medical history significant for chronic atrial fibrillation not on anticoagulation, HTN who is admitted with an acute right femoral neck fracture. Plan is for hemiarthroplasty 10/3.  Assessment and Plan: Right femoral neck fracture:  - D/w orthopedics who discussed with family. Will proceed with surgery today. Pt NPO.  - Postoperative pain medications, weight bearing per orthopedics. Will get PT/OT and monitor postoperative labs.   Delirium: Has some memory issues at baseline per family.  Agitated and delirious on admit likely in the setting of pain from his hip fracture.  Restlessness is exacerbating his pain. - Continue prn restraints. Hopeful for improvement with pain control.  - Delirium and fall precautions  Chronic atrial fibrillation: Known history of chronic atrial fibrillation not on anticoagulation or any other medications. Rate controlled.  - Can DC telemetry if stable rate on 10/4.  - D/w pt's daughter at bedside the indication for anticoagulation for thromboembolic prophylaxis, will continue to defer anticoagulation at this time.   HTN: Not on home medications.  - Monitor  Thrombocytopenia:  - Monitor in AM  Hypokalemia:  - supplementation already ordered.  Subjective: Pt confused, recently got medication for agitation.   Objective: Vitals:   01/02/22 1015 01/02/22 1030 01/02/22 1045 01/02/22 1154  BP: 120/75 120/60 (!) 115/58 99/60  Pulse: 62 62  62  Resp: 18 20 20 19   Temp:    99.8 F (37.7 C)  TempSrc:    Axillary  SpO2: 90% 90%  94%   Gen: Elderly male in no distress Pulm: Nonlabored breathing room air. Clear anterolaterally. CV: Irreg irreg, rate in 60's without murmur, rub, or gallop. No JVD, no pitting dependent edema. GI: Abdomen soft, non-tender, non-distended, with normoactive bowel  sounds.  Ext: Warm, dry, decreased muscle bulk Skin: No rashes, lesions or ulcers on visualized skin. Neuro: Drowsy, not oriented. Psych: Calm currently  Data Personally reviewed: CBC: Recent Labs  Lab 01/01/22 1955 01/02/22 0348  WBC 6.4 7.1  HGB 13.6 13.0  HCT 40.3 40.2  MCV 91.4 93.3  PLT 126* 277*   Basic Metabolic Panel: Recent Labs  Lab 01/01/22 1955 01/02/22 0348  NA 137 139  K 3.2* 3.5  CL 102 102  CO2 26 27  GLUCOSE 134* 129*  BUN 12 12  CREATININE 0.95 0.94  CALCIUM 8.5* 8.7*  MG  --  1.9   GFR: CrCl cannot be calculated (Unknown ideal weight.). Liver Function Tests: No results for input(s): "AST", "ALT", "ALKPHOS", "BILITOT", "PROT", "ALBUMIN" in the last 168 hours. No results for input(s): "LIPASE", "AMYLASE" in the last 168 hours. No results for input(s): "AMMONIA" in the last 168 hours. Coagulation Profile: Recent Labs  Lab 01/01/22 1955  INR 1.2   Cardiac Enzymes: No results for input(s): "CKTOTAL", "CKMB", "CKMBINDEX", "TROPONINI" in the last 168 hours. BNP (last 3 results) No results for input(s): "PROBNP" in the last 8760 hours. HbA1C: No results for input(s): "HGBA1C" in the last 72 hours. CBG: Recent Labs  Lab 01/02/22 1151  GLUCAP 118*   Lipid Profile: No results for input(s): "CHOL", "HDL", "LDLCALC", "TRIG", "CHOLHDL", "LDLDIRECT" in the last 72 hours. Thyroid Function Tests: No results for input(s): "TSH", "T4TOTAL", "FREET4", "T3FREE", "THYROIDAB" in the last 72 hours. Anemia Panel: No results for input(s): "VITAMINB12", "FOLATE", "FERRITIN", "TIBC", "IRON", "RETICCTPCT" in the last 72 hours. Urine analysis:  Component Value Date/Time   BILIRUBINUR neg 11/28/2012 1210   PROTEINUR neg 11/28/2012 1210   UROBILINOGEN 0.2 11/28/2012 1210   NITRITE neg 11/28/2012 1210   LEUKOCYTESUR Negative 11/28/2012 1210   No results found for this or any previous visit (from the past 240 hour(s)).   DG Knee Right Port  Result Date:  01/02/2022 CLINICAL DATA:  Hip fracture EXAM: PORTABLE RIGHT KNEE - 1-2 VIEW COMPARISON:  None Available. FINDINGS: No evidence of fracture, dislocation, or joint effusion. Medial compartment joint space narrowing. Chondrocalcinosis. Soft tissues are unremarkable. IMPRESSION: 1. No acute osseous abnormality of the right knee. 2. Medial compartment joint space narrowing. 3. Chondrocalcinosis. Electronically Signed   By: Davina Poke D.O.   On: 01/02/2022 08:31   DG Pelvis Portable  Result Date: 01/02/2022 CLINICAL DATA:  Hip fracture EXAM: PORTABLE PELVIS 1-2 VIEWS COMPARISON:  CT 01/01/2022 FINDINGS: Patient is rotated. Acute right femoral neck fracture with shortening and external rotation. No dislocation. No additional fractures. Bones appear demineralized. No appreciable soft tissue abnormality. IMPRESSION: Acute right femoral neck fracture. Electronically Signed   By: Davina Poke D.O.   On: 01/02/2022 08:30   Chest Portable 1 View  Result Date: 01/01/2022 CLINICAL DATA:  Preop, right hip fracture EXAM: PORTABLE CHEST 1 VIEW COMPARISON:  None Available. FINDINGS: Lungs are clear.  No pleural effusion or pneumothorax. The heart is normal in size. IMPRESSION: No evidence of acute cardiopulmonary disease. Electronically Signed   By: Julian Hy M.D.   On: 01/01/2022 22:18   CT Hip Right Wo Contrast  Result Date: 01/01/2022 CLINICAL DATA:  Right hip pain.  Fall EXAM: CT OF THE RIGHT HIP WITHOUT CONTRAST TECHNIQUE: Multidetector CT imaging of the right hip was performed according to the standard protocol. Multiplanar CT image reconstructions were also generated. RADIATION DOSE REDUCTION: This exam was performed according to the departmental dose-optimization program which includes automated exposure control, adjustment of the mA and/or kV according to patient size and/or use of iterative reconstruction technique. COMPARISON:  None Available. FINDINGS: Bones/Joint/Cartilage Acute right  femoral neck fracture, partially subcapital in location. There is shortening and external rotation of the femoral shaft. Impaction with varus and anterior apex angulation at the fracture site. No involvement of the intertrochanteric region. Femoroacetabular joint alignment is maintained without dislocation. The right hemipelvis is intact without fracture or diastasis. No lytic or sclerotic bone lesions are seen. Degenerative disc disease of the imaged lower lumbar spine. Ligaments Suboptimally assessed by CT. Muscles and Tendons No acute musculotendinous abnormality by CT. Soft tissues No hematoma within the soft tissues.  Aortoiliac atherosclerosis. IMPRESSION: Acute right femoral neck fracture, as described above. Aortic Atherosclerosis (ICD10-I70.0). Electronically Signed   By: Davina Poke D.O.   On: 01/01/2022 19:12   CT Head Wo Contrast  Result Date: 01/01/2022 CLINICAL DATA:  Status post fall. EXAM: CT HEAD WITHOUT CONTRAST TECHNIQUE: Contiguous axial images were obtained from the base of the skull through the vertex without intravenous contrast. RADIATION DOSE REDUCTION: This exam was performed according to the departmental dose-optimization program which includes automated exposure control, adjustment of the mA and/or kV according to patient size and/or use of iterative reconstruction technique. COMPARISON:  None Available. FINDINGS: Brain: There is moderate severity cerebral atrophy with widening of the extra-axial spaces and ventricular dilatation. There are areas of decreased attenuation within the white matter tracts of the supratentorial brain, consistent with microvascular disease changes. Vascular: No hyperdense vessel or unexpected calcification. Skull: Normal. Negative for fracture or focal lesion. Sinuses/Orbits:  No acute finding. 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 IMPRESSION: 1. No acute intracranial abnormality. 2. Generalized cerebral atrophy with  widening of the extra-axial spaces and ventricular dilatation. Electronically Signed   By: Virgina Norfolk M.D.   On: 01/01/2022 19:11   CT Cervical Spine Wo Contrast  Result Date: 01/01/2022 CLINICAL DATA:  Ground level fall at home, right hip pain EXAM: CT CERVICAL SPINE WITHOUT CONTRAST TECHNIQUE: Multidetector CT imaging of the cervical spine was performed without intravenous contrast. Multiplanar CT image reconstructions were also generated. RADIATION DOSE REDUCTION: This exam was performed according to the departmental dose-optimization program which includes automated exposure control, adjustment of the mA and/or kV according to patient size and/or use of iterative reconstruction technique. COMPARISON:  None Available. FINDINGS: Alignment: Alignment is anatomic. Skull base and vertebrae: No acute fracture. No primary bone lesion or focal pathologic process. Soft tissues and spinal canal: No prevertebral fluid or swelling. No visible canal hematoma. Disc levels: There is diffuse multilevel spondylosis and facet hypertrophy. Circumferential disc osteophyte formation is most pronounced from C3-4 through C6-7, with central canal and neural foraminal encroachment greatest at C3-4, C4-5, and C6-7. Upper chest: Deviation of the trachea to the right by an enlarged heterogeneous thyroid, most consistent with goiter. A non-emergent outpatient thyroid ultrasound could be considered if clinically warranted given patient age. Reference: J Am Coll Radiol. 2015 Feb;12(2): 143-50 The airway is patent.  Lung apices are clear. Other: Reconstructed images demonstrate no additional findings. IMPRESSION: 1. No evidence of cervical spine fracture. 2. Extensive multilevel spondylosis and facet hypertrophy. Electronically Signed   By: Randa Ngo M.D.   On: 01/01/2022 19:03    Family Communication: Daughter at bedside  Disposition: Status is: Inpatient Remains inpatient appropriate because: Operative management,  postoperative PT/OT Planned Discharge Destination: Skilled nursing facility  Patrecia Pour, MD 01/02/2022 2:11 PM Page by [email or web address]

## 2022-01-03 ENCOUNTER — Encounter (HOSPITAL_COMMUNITY): Payer: Self-pay | Admitting: Orthopedic Surgery

## 2022-01-03 ENCOUNTER — Inpatient Hospital Stay (HOSPITAL_COMMUNITY): Payer: Medicare Other

## 2022-01-03 DIAGNOSIS — S72001A Fracture of unspecified part of neck of right femur, initial encounter for closed fracture: Secondary | ICD-10-CM | POA: Diagnosis not present

## 2022-01-03 LAB — BASIC METABOLIC PANEL
Anion gap: 8 (ref 5–15)
BUN: 15 mg/dL (ref 8–23)
CO2: 25 mmol/L (ref 22–32)
Calcium: 8.4 mg/dL — ABNORMAL LOW (ref 8.9–10.3)
Chloride: 102 mmol/L (ref 98–111)
Creatinine, Ser: 1.1 mg/dL (ref 0.61–1.24)
GFR, Estimated: >60 mL/min (ref >60)
Glucose, Bld: 112 mg/dL — ABNORMAL HIGH (ref 70–99)
Potassium: 3.7 mmol/L (ref 3.5–5.1)
Sodium: 135 mmol/L (ref 135–145)

## 2022-01-03 LAB — CBC
HCT: 36.9 % — ABNORMAL LOW (ref 39.0–52.0)
Hemoglobin: 11.9 g/dL — ABNORMAL LOW (ref 13.0–17.0)
MCH: 29.7 pg (ref 26.0–34.0)
MCHC: 32.2 g/dL (ref 30.0–36.0)
MCV: 92 fL (ref 80.0–100.0)
Platelets: 107 10*3/uL — ABNORMAL LOW (ref 150–400)
RBC: 4.01 MIL/uL — ABNORMAL LOW (ref 4.22–5.81)
RDW: 14.1 % (ref 11.5–15.5)
WBC: 8.1 10*3/uL (ref 4.0–10.5)
nRBC: 0 % (ref 0.0–0.2)

## 2022-01-03 LAB — GLUCOSE, CAPILLARY: Glucose-Capillary: 101 mg/dL — ABNORMAL HIGH (ref 70–99)

## 2022-01-03 MED ORDER — HALOPERIDOL LACTATE 5 MG/ML IJ SOLN
2.5000 mg | Freq: Four times a day (QID) | INTRAMUSCULAR | Status: DC | PRN
  Administered 2022-01-03 – 2022-01-08 (×5): 2.5 mg via INTRAVENOUS
  Filled 2022-01-03 (×6): qty 1

## 2022-01-03 MED ORDER — ASPIRIN 81 MG PO CHEW: 81.0000 mg | CHEWABLE_TABLET | Freq: Two times a day (BID) | ORAL | 0 refills | Status: AC

## 2022-01-03 MED ORDER — QUETIAPINE FUMARATE 25 MG PO TABS
25.0000 mg | ORAL_TABLET | Freq: Every day | ORAL | Status: DC
  Administered 2022-01-03 – 2022-01-07 (×4): 25 mg via ORAL
  Filled 2022-01-03 (×5): qty 1

## 2022-01-03 MED ORDER — ASPIRIN 81 MG PO CHEW
81.0000 mg | CHEWABLE_TABLET | Freq: Two times a day (BID) | ORAL | Status: DC
  Administered 2022-01-03 – 2022-01-08 (×9): 81 mg via ORAL
  Filled 2022-01-03 (×11): qty 1

## 2022-01-03 MED ORDER — HYDROCODONE-ACETAMINOPHEN 10-325 MG PO TABS: 0.5000 | ORAL_TABLET | ORAL | 0 refills | Status: AC | PRN

## 2022-01-03 NOTE — Progress Notes (Signed)
PROGRESS NOTE  Noah Waters  [id number] DOB: May 13, 1924 DOA: 01/01/2022 PCP: Wardell Honour, MD   Brief Narrative:  Patient is a 88 year old male with history of chronic A-fib not on anticoagulation, hypertension who presented from home after she fell resulting in right hip pain.  Imaging showed right femoral neck fracture, orthopedics consulted, status post hemiarthroplasty on 10/3.  Awaiting for PT/OT evaluation.  Assessment & Plan:  Principal Problem:   Fracture of femoral neck, right, closed ([place]) Active Problems:   Delirium   Essential hypertension, benign   Chronic atrial fibrillation ([place])  Right femoral neck fracture: Fell at home.  Presented with severe right hip pain.  Imaging showed right femoral neck fracture.  Status post right hemiarthroplasty on 10/3.  Continue PT/OT evaluation, awaiting.  Continue pain management supportive care, bowel regimen.  On aspirin for DVT prophylaxis.  Delirium/dementia?:  Patient is confused. Hee could have developed delirium.  Had some memory issues at baseline.  Continue delirium precautions, frequent reorientation.  Minimize narcotics, sedatives.  Chronic A-fib: Not on anticoagulation.  Currently rate is controlled.  Hypertension: Not on home medications.  Continue to monitor.  Thrombocytopenia: Continue to monitor  Hypokalemia: Supplemented and corrected  Normocytic anemia: Hemoglobin slightly dropped to the range of 11.9.  Continue to monitor    Nutrition Problem: Increased nutrient needs Etiology: hip fracture, post-op healing    DVT prophylaxis:SCDs Start: 01/02/22 2133     Code Status: DNR  Family Communication: None at bedside  Patient status:Inpatient  Patient is from :Home  Anticipated discharge to:SNF  Estimated DC date:2-3 days   Consultants: Orthopedics  Procedures:ORIF  Antimicrobials:  Anti-infectives (From admission, onward)    Start     Dose/Rate Route Frequency Ordered Stop   01/03/22  0200  ceFAZolin (ANCEF) IVPB 2g/100 mL premix        2 g 200 mL/hr over 30 Minutes Intravenous Every 8 hours 01/02/22 2132 01/03/22 2159   01/02/22 1545  ceFAZolin (ANCEF) IVPB 2g/100 mL premix        2 g 200 mL/hr over 30 Minutes Intravenous To [facility] 01/02/22 1537 01/02/22 1753       Subjective: Patient seen and examined at bedside today.  Hemodynamically stable, comfortably sitting on the chair, eating his lunch.  Appears confused but not agitated.  Objective: Vitals:   01/02/22 2124 01/03/22 0108 01/03/22 0546 01/03/22 0900  BP: 120/65   139/83  Pulse: 65   70  Resp:  16 17 18   Temp: 100 F (37.8 C) 98.5 F (36.9 C) 99.1 F (37.3 C) 98.2 F (36.8 C)  TempSrc: Axillary Axillary Axillary Oral  SpO2: 96%   98%    Intake/Output Summary (Last 24 hours) at 01/03/2022 1132 Last data filed at 01/03/2022 0119 Gross per 24 hour  Intake 1150 ml  Output 400 ml  Net 750 ml   There were no vitals filed for this visit.  Examination:  General exam: Overall comfortable, not in distress, pleasantly confused HEENT: PERRL Respiratory system:  no wheezes or crackles  Cardiovascular system: S1 & S2 heard, RRR.  Gastrointestinal system: Abdomen is nondistended, soft and nontender. Central nervous system: Alert and but not oriented, obeys  commands. Extremities: No edema, no clubbing ,no cyanosis.  Soft: On the right hip Skin: No rashes, no ulcers,no icterus     Data Reviewed: I have personally reviewed following labs and imaging studies  CBC: Recent Labs  Lab 01/01/22 1955 01/02/22 0348 01/03/22 0339  WBC 6.4 7.1 8.1  HGB 13.6 13.0 11.9*  HCT 40.3 40.2 36.9*  MCV 91.4 93.3 92.0  PLT 126* 129* [id number]*   Basic Metabolic Panel: Recent Labs  Lab 01/01/22 1955 01/02/22 0348 01/03/22 0339  NA 137 139 135  K 3.2* 3.5 3.7  CL 102 102 102  CO2 26 27 25   GLUCOSE 134* 129* 112*  BUN 12 12 15   CREATININE 0.95 0.94 1.10  CALCIUM 8.5* 8.7* 8.4*  MG  --  1.9  --       Recent Results (from the past 240 hour(s))  Surgical pcr screen     Status: None   Collection Time: 01/02/22  3:40 PM   Specimen: Nasal Mucosa; Nasal Swab  Result Value Ref Range Status   MRSA, PCR NEGATIVE NEGATIVE Final   Staphylococcus aureus NEGATIVE NEGATIVE Final    Comment: (NOTE) The Xpert SA Assay (FDA approved for NASAL specimens in patients 73 years of age and older), is one component of a comprehensive surveillance program. It is not intended to diagnose infection nor to guide or monitor treatment. Performed at [facility], [place] [address], [place], [place] [address]      Radiology Studies: DG Pelvis Portable  Result Date: 01/03/2022 CLINICAL DATA:  RIGHT hip replacement surgery yesterday. EXAM: PORTABLE PELVIS 1-2 VIEWS COMPARISON:  Plain film of the pelvis dated 01/02/2022 FINDINGS: Interval RIGHT hip arthroplasty. Hardware appears intact and appropriately positioned. Expected postsurgical changes within the overlying soft tissues. IMPRESSION: Interval RIGHT hip arthroplasty. No evidence of surgical complicating feature. Electronically Signed   By: Franki Cabot M.D.   On: 01/03/2022 07:41   DG HIP UNILAT WITH PELVIS 1V RIGHT  Result Date: 01/02/2022 CLINICAL DATA:  Right femoral neck fracture EXAM: DG HIP (WITH OR WITHOUT PELVIS) 1V RIGHT COMPARISON:  Film from earlier in the same day. FLUOROSCOPY TIME:  Radiation Exposure Index (as provided by the fluoroscopic device): 0.61 mGy If the device does not provide the exposure index: Fluoroscopy Time:  8 seconds Number of Acquired Images:  4 FINDINGS: Initial images demonstrate a spacer in the proximal femur. The femoral neck and femoral head of been removed. Subsequent placement of the prosthesis is noted in satisfactory position. No acute bony or soft tissue abnormality is noted. IMPRESSION: Right hip replacement Electronically Signed   By: Inez Catalina M.D.   On: 01/02/2022 19:22   DG C-Arm 1-60 Min-No  Report  Result Date: 01/02/2022 Fluoroscopy was utilized by the requesting physician.  No radiographic interpretation.   DG Knee Right Port  Result Date: 01/02/2022 CLINICAL DATA:  Hip fracture EXAM: PORTABLE RIGHT KNEE - 1-2 VIEW COMPARISON:  None Available. FINDINGS: No evidence of fracture, dislocation, or joint effusion. Medial compartment joint space narrowing. Chondrocalcinosis. Soft tissues are unremarkable. IMPRESSION: 1. No acute osseous abnormality of the right knee. 2. Medial compartment joint space narrowing. 3. Chondrocalcinosis. Electronically Signed   By: Davina Poke D.O.   On: 01/02/2022 08:31   DG Pelvis Portable  Result Date: 01/02/2022 CLINICAL DATA:  Hip fracture EXAM: PORTABLE PELVIS 1-2 VIEWS COMPARISON:  CT 01/01/2022 FINDINGS: Patient is rotated. Acute right femoral neck fracture with shortening and external rotation. No dislocation. No additional fractures. Bones appear demineralized. No appreciable soft tissue abnormality. IMPRESSION: Acute right femoral neck fracture. Electronically Signed   By: Davina Poke D.O.   On: 01/02/2022 08:30   Chest Portable 1 View  Result Date: 01/01/2022 CLINICAL DATA:  Preop, right hip fracture EXAM: PORTABLE CHEST 1 VIEW COMPARISON:  None Available.  FINDINGS: Lungs are clear.  No pleural effusion or pneumothorax. The heart is normal in size. IMPRESSION: No evidence of acute cardiopulmonary disease. Electronically Signed   By: Julian Hy M.D.   On: 01/01/2022 22:18   CT Hip Right Wo Contrast  Result Date: 01/01/2022 CLINICAL DATA:  Right hip pain.  Fall EXAM: CT OF THE RIGHT HIP WITHOUT CONTRAST TECHNIQUE: Multidetector CT imaging of the right hip was performed according to the standard protocol. Multiplanar CT image reconstructions were also generated. RADIATION DOSE REDUCTION: This exam was performed according to the departmental dose-optimization program which includes automated exposure control, adjustment of the mA  and/or kV according to patient size and/or use of iterative reconstruction technique. COMPARISON:  None Available. FINDINGS: Bones/Joint/Cartilage Acute right femoral neck fracture, partially subcapital in location. There is shortening and external rotation of the femoral shaft. Impaction with varus and anterior apex angulation at the fracture site. No involvement of the intertrochanteric region. Femoroacetabular joint alignment is maintained without dislocation. The right hemipelvis is intact without fracture or diastasis. No lytic or sclerotic bone lesions are seen. Degenerative disc disease of the imaged lower lumbar spine. Ligaments Suboptimally assessed by CT. Muscles and Tendons No acute musculotendinous abnormality by CT. Soft tissues No hematoma within the soft tissues.  Aortoiliac atherosclerosis. IMPRESSION: Acute right femoral neck fracture, as described above. Aortic Atherosclerosis (ICD10-I70.0). Electronically Signed   By: Davina Poke D.O.   On: 01/01/2022 19:12   CT Head Wo Contrast  Result Date: 01/01/2022 CLINICAL DATA:  Status post fall. EXAM: CT HEAD WITHOUT CONTRAST TECHNIQUE: Contiguous axial images were obtained from the base of the skull through the vertex without intravenous contrast. RADIATION DOSE REDUCTION: This exam was performed according to the departmental dose-optimization program which includes automated exposure control, adjustment of the mA and/or kV according to patient size and/or use of iterative reconstruction technique. COMPARISON:  None Available. FINDINGS: Brain: There is moderate severity cerebral atrophy with widening of the extra-axial spaces and ventricular dilatation. There are areas of decreased attenuation within the white matter tracts of the supratentorial brain, consistent with microvascular disease changes. Vascular: No hyperdense vessel or unexpected calcification. Skull: Normal. Negative for fracture or focal lesion. Sinuses/Orbits: No acute finding.  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 IMPRESSION: 1. No acute intracranial abnormality. 2. Generalized cerebral atrophy with widening of the extra-axial spaces and ventricular dilatation. Electronically Signed   By: Virgina Norfolk M.D.   On: 01/01/2022 19:11   CT Cervical Spine Wo Contrast  Result Date: 01/01/2022 CLINICAL DATA:  Ground level fall at home, right hip pain EXAM: CT CERVICAL SPINE WITHOUT CONTRAST TECHNIQUE: Multidetector CT imaging of the cervical spine was performed without intravenous contrast. Multiplanar CT image reconstructions were also generated. RADIATION DOSE REDUCTION: This exam was performed according to the departmental dose-optimization program which includes automated exposure control, adjustment of the mA and/or kV according to patient size and/or use of iterative reconstruction technique. COMPARISON:  None Available. FINDINGS: Alignment: Alignment is anatomic. Skull base and vertebrae: No acute fracture. No primary bone lesion or focal pathologic process. Soft tissues and spinal canal: No prevertebral fluid or swelling. No visible canal hematoma. Disc levels: There is diffuse multilevel spondylosis and facet hypertrophy. Circumferential disc osteophyte formation is most pronounced from C3-4 through C6-7, with central canal and neural foraminal encroachment greatest at C3-4, C4-5, and C6-7. Upper chest: Deviation of the trachea to the right by an enlarged heterogeneous thyroid, most consistent with  goiter. A non-emergent outpatient thyroid ultrasound could be considered if clinically warranted given patient age. Reference: J Am Coll Radiol. 2015 Feb;12(2): 143-50 The airway is patent.  Lung apices are clear. Other: Reconstructed images demonstrate no additional findings. IMPRESSION: 1. No evidence of cervical spine fracture. 2. Extensive multilevel spondylosis and facet hypertrophy. Electronically Signed   By: Randa Ngo M.D.   On:  01/01/2022 19:03    Scheduled Meds:  acetaminophen  500 mg Oral Q6H   aspirin  81 mg Oral BID WC   docusate sodium  100 mg Oral BID   feeding supplement  237 mL Oral BID BM   multivitamin with minerals  1 tablet Oral Daily   senna  1 tablet Oral BID   Continuous Infusions:   ceFAZolin (ANCEF) IV 2 g (01/03/22 0119)   methocarbamol (ROBAXIN) IV       LOS: 2 days   Shelly Coss, MD [facility] P10/07/2021, 11:32 AM

## 2022-01-03 NOTE — Evaluation (Signed)
Physical Therapy Evaluation Patient Details Name: Noah Waters MRN: [id number] DOB: 1924-11-04 Today's Date: 01/03/2022  History of Present Illness  Pt is a 88 year old man admitted 01/02/22 after a fall in which he sustained a R femoral neck fx. Underwent R hip hemiarthroplasty, anterior approach. PMH: dementia, afib.  Clinical Impression  Pt admitted with above diagnosis. Pt needed mod assist of 2 for all mobility. Pt will most likely need SNF with therapy unless he has 2 person assist at home. Pt very confused and talking about wife careing for him and then says "my wife is dead.". Pt with poor safety awareness and poor cognition that will limit function. Will follow acutely.  Pt currently with functional limitations due to the deficits listed below (see PT Problem List). Pt will benefit from skilled PT to increase their independence and safety with mobility to allow discharge to the venue listed below.          Recommendations for follow up therapy are one component of a multi-disciplinary discharge planning process, led by the attending physician.  Recommendations may be updated based on patient status, additional functional criteria and insurance authorization.  Follow Up Recommendations Skilled nursing-short term rehab (<3 hours/day) Can patient physically be transported by private vehicle: No    Assistance Recommended at Discharge Frequent or constant Supervision/Assistance  Patient can return home with the following  Two people to help with walking and/or transfers;A lot of help with bathing/dressing/bathroom;Assistance with cooking/housework;Assist for transportation;Help with stairs or ramp for entrance    Equipment Recommendations Other (comment) (TBA)  Recommendations for Other Services       Functional Status Assessment Patient has had a recent decline in their functional status and demonstrates the ability to make significant improvements in function in a reasonable and  predictable amount of time.     Precautions / Restrictions Precautions Precautions: Fall Restrictions Weight Bearing Restrictions: No Other Position/Activity Restrictions: WBAT R LE      Mobility  Bed Mobility Overal bed mobility: Needs Assistance Bed Mobility: Supine to Sit     Supine to sit: +2 for physical assistance, Mod assist     General bed mobility comments: assist for LEs over EOB, to raise trunk and use of bed pad to advance hips to EOB    Transfers Overall transfer level: Needs assistance Equipment used: Rolling walker (2 wheels) Transfers: Sit to/from Stand, Bed to chair/wheelchair/BSC Sit to Stand: +2 physical assistance, Mod assist Stand pivot transfers: +2 physical assistance, Mod assist         General transfer comment: cues for hand placement, assist to rise and steady, pt with difficulty weight bearing on R LE to pivot to chair    Ambulation/Gait                  Stairs            Wheelchair Mobility    Modified Rankin (Stroke Patients Only)       Balance Overall balance assessment: Needs assistance Sitting-balance support: Bilateral upper extremity supported, Feet supported Sitting balance-Leahy Scale: Fair     Standing balance support: Bilateral upper extremity supported Standing balance-Leahy Scale: Poor Standing balance comment: relies on UE support and external support of 2 persons                             Pertinent Vitals/Pain Pain Assessment Pain Assessment: Faces Faces Pain Scale: Hurts little more Breathing: normal Negative  Vocalization: none Facial Expression: smiling or inexpressive Body Language: rigid, fists clenched, knees up, pushing/pulling away, strikes out Consolability: no need to console PAINAD Score: 2 Pain Location: R hip Pain Descriptors / Indicators: Sore, Discomfort Pain Intervention(s): Limited activity within patient's tolerance, Monitored during session, Repositioned     Home Living Family/patient expects to be discharged to:: Skilled nursing facility Living Arrangements: Alone                      Prior Function Prior Level of Function : Patient poor historian/Family not available                     Hand Dominance   Dominant Hand: Right    Extremity/Trunk Assessment   Upper Extremity Assessment Upper Extremity Assessment: Defer to OT evaluation    Lower Extremity Assessment Lower Extremity Assessment: RLE deficits/detail RLE: Unable to fully assess due to pain    Cervical / Trunk Assessment Cervical / Trunk Assessment: Kyphotic  Communication   Communication: HOH  Cognition Arousal/Alertness: Awake/alert Behavior During Therapy: Flat affect Overall Cognitive Status: No family/caregiver present to determine baseline cognitive functioning                                 General Comments: pt with history of dementia, disoriented except to self, unable to offer PLOF or home set up, perseverative on question about where he lives giving street directions, pt can follow commands within context of activity        General Comments General comments (skin integrity, edema, etc.): VSS    Exercises     Assessment/Plan    PT Assessment Patient needs continued PT services  PT Problem List Decreased activity tolerance;Decreased balance;Decreased strength;Decreased range of motion;Decreased knowledge of use of DME;Decreased safety awareness;Decreased knowledge of precautions;Pain       PT Treatment Interventions DME instruction;Gait training;Functional mobility training;Therapeutic activities;Therapeutic exercise;Balance training;Patient/family education    PT Goals (Current goals can be found in the Care Plan section)  Acute Rehab PT Goals Patient Stated Goal: unable to state PT Goal Formulation: Patient unable to participate in goal setting Time For Goal Achievement: 01/17/22 Potential to Achieve Goals:  Fair    Frequency Min 5X/week     Co-evaluation PT/OT/SLP Co-Evaluation/Treatment: Yes Reason for Co-Treatment: Complexity of the patient's impairments (multi-system involvement);For patient/therapist safety PT goals addressed during session: Mobility/safety with mobility         AM-PAC PT "6 Clicks" Mobility  Outcome Measure Help needed turning from your back to your side while in a flat bed without using bedrails?: A Lot Help needed moving from lying on your back to sitting on the side of a flat bed without using bedrails?: A Lot Help needed moving to and from a bed to a chair (including a wheelchair)?: Total Help needed standing up from a chair using your arms (e.g., wheelchair or bedside chair)?: Total Help needed to walk in hospital room?: Total Help needed climbing 3-5 steps with a railing? : Total 6 Click Score: 8    End of Session Equipment Utilized During Treatment: Gait belt Activity Tolerance: Patient limited by fatigue;Patient limited by pain Patient left: in chair;with call bell/phone within reach;with chair alarm set Nurse Communication: Mobility status;Need for lift equipment Charlaine Dalton) PT Visit Diagnosis: Unsteadiness on feet (R26.81);Muscle weakness (generalized) (M62.81);Pain Pain - Right/Left: Right Pain - part of body: Leg    Time: [id number] PT  Time Calculation (min) (ACUTE ONLY): 17 min   Charges:   PT Evaluation $PT Eval Moderate Complexity: 1 Mod          Gwendolin Briel M,PT Acute Rehab Services [phone]   Alvira Philips 01/03/2022, 2:42 PM

## 2022-01-03 NOTE — Progress Notes (Signed)
    Subjective: Patient has reported memory issues at baseline. He is oriented to self this morning.  Examination limited due to cognitive issues.  Patient denies pain.  Denies N/V/CP/SOB/Abd pain.   Objective:   VITALS:   Vitals:   01/02/22 2108 01/02/22 2124 01/03/22 0108 01/03/22 0546  BP: 132/75 120/65    Pulse: 68 65    Resp: 15  16 17   Temp: 99.2 F (37.3 C) 100 F (37.8 C) 98.5 F (36.9 C) 99.1 F (37.3 C)  TempSrc:  Axillary Axillary Axillary  SpO2: 95% 96%      Patient is lying comfortably in bed. NAD. He is oriented to self this morning only. He responds yes and no to questions. ABD soft Neurovascular intact Sensation intact distally Intact pulses distally Dorsiflexion/Plantar flexion intact Incision: dressing C/D/I No cellulitis present Compartment soft Patient states he can feel light touch, but unable to fully assess due to cognitive impairment. No wincing or facial grimacing during exam. Including will log roll of hip.   Lab Results  Component Value Date   WBC 8.1 01/03/2022   HGB 11.9 (L) 01/03/2022   HCT 36.9 (L) 01/03/2022   MCV 92.0 01/03/2022   PLT 107 (L) 01/03/2022   BMET    Component Value Date/Time   NA 135 01/03/2022 0339   NA 140 08/19/2017 1119   K 3.7 01/03/2022 0339   CL 102 01/03/2022 0339   CO2 25 01/03/2022 0339   GLUCOSE 112 (H) 01/03/2022 0339   BUN 15 01/03/2022 0339   BUN 11 08/19/2017 1119   CREATININE 1.10 01/03/2022 0339   CREATININE 1.14 (H) 09/28/2020 1008   CALCIUM 8.4 (L) 01/03/2022 0339   GFRNONAA >60 01/03/2022 0932     Assessment/Plan: 1 Day Post-Op   Principal Problem:   Fracture of femoral neck, right, closed ([place]) Active Problems:   Essential hypertension, benign   Chronic atrial fibrillation ([facility])   Delirium   WBAT with walker DVT ppx: Aspirin, SCDs, TEDS PO pain control: Has only received tylenol for pain. Seems controlled.  PT/OT: PT has not seen yet. PT to come by today.  Dispo: Patient  under care of the medical team, disposition per their recommendation.    Charlott Rakes, PA-C 01/03/2022, 6:59 AM   [facility]  [place] [address], [place], [place] [address] Phone: [phone] [email or web address] Facebook  [employer]

## 2022-01-03 NOTE — Evaluation (Signed)
Occupational Therapy Evaluation Patient Details Name: Noah Waters MRN: [id number] DOB: 1924-12-28 Today's Date: 01/03/2022   History of Present Illness Pt is a 88 year old man admitted 01/02/22 after a fall in which he sustained a R femoral neck fx. Underwent R hip hemiarthroplasty, anterior approach. PMH: dementia, afib.   Clinical Impression   Pt is a poor historian, no family available to confirm PLOF. Pt presents with impaired cognition, R hip pain, generalized weakness and impaired standing balance. He requires +2 moderate assistance for all mobility. Pt needs min to total assist for ADLs. Recommending ST rehab in SNF upon discharge. Will follow acutely.      Recommendations for follow up therapy are one component of a multi-disciplinary discharge planning process, led by the attending physician.  Recommendations may be updated based on patient status, additional functional criteria and insurance authorization.   Follow Up Recommendations  Skilled nursing-short term rehab (<3 hours/day)    Assistance Recommended at Discharge Frequent or constant Supervision/Assistance  Patient can return home with the following Two people to help with walking and/or transfers;Two people to help with bathing/dressing/bathroom;Direct supervision/assist for medications management;Assistance with cooking/housework;Direct supervision/assist for financial management;Assist for transportation;Help with stairs or ramp for entrance    Functional Status Assessment  Patient has had a recent decline in their functional status and demonstrates the ability to make significant improvements in function in a reasonable and predictable amount of time.  Equipment Recommendations  Other (comment) (defer to next venue)    Recommendations for Other Services       Precautions / Restrictions Precautions Precautions: Fall Restrictions Weight Bearing Restrictions: No Other Position/Activity Restrictions: WBAT R LE       Mobility Bed Mobility Overal bed mobility: Needs Assistance Bed Mobility: Supine to Sit     Supine to sit: +2 for physical assistance, Mod assist     General bed mobility comments: assist for LEs over EOB, to raise trunk and use of bed pad to advance hips to EOB    Transfers Overall transfer level: Needs assistance Equipment used: Rolling walker (2 wheels) Transfers: Sit to/from Stand, Bed to chair/wheelchair/BSC Sit to Stand: +2 physical assistance, Mod assist Stand pivot transfers: +2 physical assistance, Mod assist         General transfer comment: cues for hand placement, assist to rise and steady, pt with difficulty weight bearing on R LE to pivot to chair      Balance Overall balance assessment: Needs assistance   Sitting balance-Leahy Scale: Fair     Standing balance support: Bilateral upper extremity supported Standing balance-Leahy Scale: Poor                             ADL either performed or assessed with clinical judgement   ADL Overall ADL's : Needs assistance/impaired Eating/Feeding: Minimal assistance;Sitting Eating/Feeding Details (indicate cue type and reason): assisted to open packages and cut food Grooming: Wash/dry hands;Sitting;Set up   Upper Body Bathing: Moderate assistance;Sitting   Lower Body Bathing: +2 for physical assistance;Total assistance;Sit to/from stand   Upper Body Dressing : Minimal assistance;Sitting   Lower Body Dressing: +2 for physical assistance;Total assistance;Sit to/from stand   Toilet Transfer: +2 for physical assistance;Moderate assistance;Stand-pivot;Rolling walker (2 wheels)   Toileting- Clothing Manipulation and Hygiene: +2 for physical assistance;Total assistance;Sit to/from stand               Vision Patient Visual Report: No change from baseline Additional Comments: pt  closing his eyes intermittently during session     Perception     Praxis      Pertinent Vitals/Pain Pain  Assessment Pain Assessment: Faces Faces Pain Scale: Hurts little more Pain Location: R hip Pain Descriptors / Indicators: Sore, Discomfort Pain Intervention(s): Monitored during session, Repositioned, Ice applied     Hand Dominance Right   Extremity/Trunk Assessment Upper Extremity Assessment Upper Extremity Assessment: Generalized weakness;Overall [facility] for tasks assessed   Lower Extremity Assessment Lower Extremity Assessment: Defer to PT evaluation   Cervical / Trunk Assessment Cervical / Trunk Assessment: Kyphotic   Communication Communication Communication: HOH   Cognition Arousal/Alertness: Awake/alert Behavior During Therapy: Flat affect Overall Cognitive Status: No family/caregiver present to determine baseline cognitive functioning                                 General Comments: pt with history of dementia, disoriented except to self, unable to offer PLOF or home set up, perseverative on question about where he lives giving street directions, pt can follow commands within context of activity     General Comments       Exercises     Shoulder Instructions      Home Living Family/patient expects to be discharged to:: Skilled nursing facility Living Arrangements: Alone                                      Prior Functioning/Environment Prior Level of Function : Patient poor historian/Family not available                        OT Problem List: Decreased strength;Impaired balance (sitting and/or standing);Decreased cognition;Decreased safety awareness;Decreased knowledge of use of DME or AE;Pain      OT Treatment/Interventions: Self-care/ADL training;DME and/or AE instruction;Therapeutic activities;Patient/family education;Balance training    OT Goals(Current goals can be found in the care plan section) Acute Rehab OT Goals OT Goal Formulation: Patient unable to participate in goal setting Time For Goal Achievement:  01/17/22 Potential to Achieve Goals: Good  OT Frequency: Min 2X/week    Co-evaluation PT/OT/SLP Co-Evaluation/Treatment: Yes Reason for Co-Treatment: For patient/therapist safety          AM-PAC OT "6 Clicks" Daily Activity     Outcome Measure Help from another person eating meals?: A Little Help from another person taking care of personal grooming?: A Little Help from another person toileting, which includes using toliet, bedpan, or urinal?: Total Help from another person bathing (including washing, rinsing, drying)?: A Lot Help from another person to put on and taking off regular upper body clothing?: A Little Help from another person to put on and taking off regular lower body clothing?: Total 6 Click Score: 13   End of Session Equipment Utilized During Treatment: Rolling walker (2 wheels);Gait belt Nurse Communication: Mobility status  Activity Tolerance: Patient tolerated treatment well Patient left: in chair;with call bell/phone within reach;with chair alarm set  OT Visit Diagnosis: Unsteadiness on feet (R26.81);Other abnormalities of gait and mobility (R26.89);Pain;Cognitive communication deficit (R41.841);History of falling (Z91.81)                Time: 3810-1751 OT Time Calculation (min): 21 min Charges:  OT General Charges $OT Visit: 1 Visit OT Evaluation $OT Eval Moderate Complexity: [place], OTR/L [place] Office: [phone]  Malka So 01/03/2022, 1:46 PM

## 2022-01-04 ENCOUNTER — Inpatient Hospital Stay (HOSPITAL_COMMUNITY): Payer: Medicare Other

## 2022-01-04 LAB — CBC
HCT: 33.4 % — ABNORMAL LOW (ref 39.0–52.0)
Hemoglobin: 11.3 g/dL — ABNORMAL LOW (ref 13.0–17.0)
MCH: 30.4 pg (ref 26.0–34.0)
MCHC: 33.8 g/dL (ref 30.0–36.0)
MCV: 89.8 fL (ref 80.0–100.0)
Platelets: 97 10*3/uL — ABNORMAL LOW (ref 150–400)
RBC: 3.72 MIL/uL — ABNORMAL LOW (ref 4.22–5.81)
RDW: 13.9 % (ref 11.5–15.5)
WBC: 7.8 10*3/uL (ref 4.0–10.5)
nRBC: 0 % (ref 0.0–0.2)

## 2022-01-04 LAB — GLUCOSE, CAPILLARY
Glucose-Capillary: 86 mg/dL (ref 70–99)
Glucose-Capillary: 110 mg/dL — ABNORMAL HIGH (ref 70–99)
Glucose-Capillary: 113 mg/dL — ABNORMAL HIGH (ref 70–99)

## 2022-01-04 NOTE — Progress Notes (Signed)
PT reported patient could not hold himself up with therapy.  This was different from yesterday.  I evaluated and did have slow response time and some weakness in right leg, however this was his surgery side of body.  He was noted to have received haldol at midnight and could be reason for some weakness.  Mentation is baseline for him. Notified Charge and called rapid response to lay some extra eyes to evaluate.  MD came to bedside as well and will proceed with MRI to rule out neuro injury.

## 2022-01-04 NOTE — Care Management Important Message (Signed)
Important Message  Patient Details  Name: Noah Waters MRN: [id number] Date of Birth: [birth year]   Medicare Important Message Given:  Yes     Hannah Beat 01/04/2022, 2:34 PM

## 2022-01-04 NOTE — Progress Notes (Signed)
Physical Therapy Treatment Patient Details Name: Noah Waters MRN: [id number] DOB: 1925/02/13 Today's Date: 01/04/2022   History of Present Illness Pt is a 88 year old man admitted 01/02/22 after a fall in which he sustained a R femoral neck fx. Underwent R hip hemiarthroplasty, anterior approach. PMH: dementia, afib.    PT Comments    Pt admitted with above diagnosis. Pt with decr mobility ability today with pt unable to sit EOB with significant right lateral lean and decr use of right hemibody when sitting. Pt could not maintain grip on RW to attempt stand and again lateral lean to right so pronounced needing mod to max assist of 2 to even sit EOB.  Contacted nurse to notify of change in mobility as well as noted slight facial droop right face.  Nurse came in to assess pt.  Nurse did make PT aware pt had Haldol last night.  Nurse contacting MD to see if any action needs to be taken. Pt frequency set at 3x week as he will do therapy at SNF and that is appropriate frequency.   Pt currently with functional limitations due to balance and endurance deficits. Pt will benefit from skilled PT to increase their independence and safety with mobility to allow discharge to the venue listed below.      Recommendations for follow up therapy are one component of a multi-disciplinary discharge planning process, led by the attending physician.  Recommendations may be updated based on patient status, additional functional criteria and insurance authorization.  Follow Up Recommendations  Skilled nursing-short term rehab (<3 hours/day) Can patient physically be transported by private vehicle: No   Assistance Recommended at Discharge Frequent or constant Supervision/Assistance  Patient can return home with the following Two people to help with walking and/or transfers;A lot of help with bathing/dressing/bathroom;Assistance with cooking/housework;Assist for transportation;Help with stairs or ramp for entrance    Equipment Recommendations  Other (comment) (TBA)    Recommendations for Other Services       Precautions / Restrictions Precautions Precautions: Fall Restrictions Weight Bearing Restrictions: No Other Position/Activity Restrictions: WBAT R LE     Mobility  Bed Mobility Overal bed mobility: Needs Assistance Bed Mobility: Supine to Sit, Sit to Supine     Supine to sit: +2 for physical assistance, Max assist Sit to supine: Max assist, +2 for physical assistance   General bed mobility comments: assist for LEs over EOB, to raise trunk and use of bed pad to advance hips to EOB    Transfers Overall transfer level: Needs assistance Equipment used: Rolling walker (2 wheels) Transfers: Sit to/from Stand, Bed to chair/wheelchair/BSC Sit to Stand: +2 physical assistance, Max assist, Total assist           General transfer comment: cues for hand placement, max to total assist to rise however pt unable to stand to RW as he was leaning to right and appeared unable to grip the RW as well. Pt could not power up on LES and right hemibody not contracting for stance.  Upon assessing pt further, appeared to have some facial droop on left face.  Assisted pt back to bed and called nursing.    Ambulation/Gait                   Stairs             Wheelchair Mobility    Modified Rankin (Stroke Patients Only)       Balance Overall balance assessment: Needs assistance Sitting-balance support:  Bilateral upper extremity supported, Feet supported Sitting balance-Leahy Scale: Zero Sitting balance - Comments: Pt leaning to right and could not correct with assist and cues. Needed max assist to sit EOB.   Standing balance support: Bilateral upper extremity supported Standing balance-Leahy Scale: Zero Standing balance comment: unable to stand to RW with +2 max to total assist.                            Cognition Arousal/Alertness: Awake/alert Behavior During  Therapy: Flat affect Overall Cognitive Status: No family/caregiver present to determine baseline cognitive functioning                                 General Comments: pt with history of dementia, disoriented except to self, unable to offer PLOF or home set up,  pt can follow commands within context of activity but inconsistently.        Exercises General Exercises - Lower Extremity Ankle Circles/Pumps: AROM, Both, 10 reps, Supine Quad Sets: AROM, Both, 10 reps, Supine Long Arc Quad: AROM, Both, 5 reps, Seated Heel Slides: AAROM, Both, 10 reps, Supine    General Comments General comments (skin integrity, edema, etc.): Pt in afib.      Pertinent Vitals/Pain Pain Assessment Pain Assessment: Faces Faces Pain Scale: Hurts even more Breathing: normal Negative Vocalization: none Facial Expression: smiling or inexpressive Body Language: rigid, fists clenched, knees up, pushing/pulling away, strikes out Consolability: no need to console PAINAD Score: 2 Pain Location: R hip Pain Descriptors / Indicators: Sore, Discomfort Pain Intervention(s): Limited activity within patient's tolerance, Monitored during session, Repositioned    Home Living                          Prior Function            PT Goals (current goals can now be found in the care plan section) Acute Rehab PT Goals Patient Stated Goal: unable to state Progress towards PT goals: Not progressing toward goals - comment (symptoms of facial droop and right hemibody weakness)    Frequency    Min 3X/week      PT Plan Current plan remains appropriate;Frequency needs to be updated    Co-evaluation              AM-PAC PT "6 Clicks" Mobility   Outcome Measure  Help needed turning from your back to your side while in a flat bed without using bedrails?: Total Help needed moving from lying on your back to sitting on the side of a flat bed without using bedrails?: Total Help needed  moving to and from a bed to a chair (including a wheelchair)?: Total Help needed standing up from a chair using your arms (e.g., wheelchair or bedside chair)?: Total Help needed to walk in hospital room?: Total Help needed climbing 3-5 steps with a railing? : Total 6 Click Score: 6    End of Session Equipment Utilized During Treatment: Gait belt Activity Tolerance: Patient limited by fatigue;Patient limited by pain (Symptoms of weakness right hemibody) Patient left: with call bell/phone within reach;in bed;with bed alarm set;with nursing/sitter in room;with SCD's reapplied Nurse Communication: Mobility status (Pt with facial droop and decr use of right hemibody) PT Visit Diagnosis: Unsteadiness on feet (R26.81);Muscle weakness (generalized) (M62.81);Pain Pain - Right/Left: Right Pain - part of body: Leg  Time: [id number] PT Time Calculation (min) (ACUTE ONLY): 21 min  Charges:  $Therapeutic Activity: 8-22 mins                     [facility] M,PT Acute Rehab Services [phone]    Alvira Philips 01/04/2022, 10:08 AM

## 2022-01-04 NOTE — Plan of Care (Signed)
  Problem: Clinical Measurements: Goal: Will remain free from infection Outcome: Progressing   Problem: Coping: Goal: Level of anxiety will decrease Outcome: Not Progressing   Problem: Safety: Goal: Ability to remain free from injury will improve Outcome: Progressing   Problem: Clinical Measurements: Goal: Will remain free from infection 01/04/2022 0542 by Verdia Kuba, RN Outcome: Progressing 01/04/2022 0541 by Verdia Kuba, RN Outcome: Progressing Goal: Diagnostic test results will improve Outcome: Progressing   Problem: Coping: Goal: Level of anxiety will decrease Outcome: Not Progressing

## 2022-01-04 NOTE — Progress Notes (Signed)
PROGRESS NOTE  BRANCE DARTT  [id number] DOB: 02/16/1925 DOA: 01/01/2022 PCP: Wardell Honour, MD   Brief Narrative:  Patient is a 88 year old male with history of chronic A-fib not on anticoagulation, hypertension who presented from home after she fell resulting in right hip pain.  Imaging showed right femoral neck fracture, orthopedics consulted, status post hemiarthroplasty on 10/3.  PT/OT recommending SNF.  Hospital course remarkable for agitation, delirium.  Assessment & Plan:  Principal Problem:   Fracture of femoral neck, right, closed ([place]) Active Problems:   Delirium   Essential hypertension, benign   Chronic atrial fibrillation ([place])  Right femoral neck fracture: Fell at home.  Presented with severe right hip pain.  Imaging showed right femoral neck fracture.  Status post right hemiarthroplasty on 10/3.   Continue pain management supportive care, bowel regimen.  On aspirin for DVT prophylaxis. PT/OT recommending SNF.  TOC following  Delirium/dementia?:  Patient is confused. He could have developed delirium.  Has memory issues,confused at baseline as per daughter.  Continue delirium precautions, frequent reorientation.  Minimize narcotics, sedatives. Started on Seroquel at bedtime which has improved his orientation today.  He is not agitated today, obeys commands. There was concern for stroke from PT saying that he is weak on his right side.  He does not have any upper extremity weakness.  He has questionable right facial droop.  Pending MRI  Chronic A-fib: Not on anticoagulation.  Currently rate is controlled.  Hypertension: Not on home medications.  Continue to monitor.  Thrombocytopenia: Continue to monitor  Hypokalemia: Supplemented and corrected  Normocytic anemia: Hemoglobin slightly dropped to the range of 11.3.  Continue to monitor    Nutrition Problem: Increased nutrient needs Etiology: hip fracture, post-op healing    DVT prophylaxis:SCDs Start:  01/02/22 2133     Code Status: DNR  Family Communication: Called and discussed with daughter on phone on 10/5  Patient status:Inpatient  Patient is from :Home  Anticipated discharge to:SNF  Estimated DC date:2-3 days   Consultants: Orthopedics  Procedures:ORIF  Antimicrobials:  Anti-infectives (From admission, onward)    Start     Dose/Rate Route Frequency Ordered Stop   01/03/22 0200  ceFAZolin (ANCEF) IVPB 2g/100 mL premix        2 g 200 mL/hr over 30 Minutes Intravenous Every 8 hours 01/02/22 2132 01/04/22 0856   01/02/22 1545  ceFAZolin (ANCEF) IVPB 2g/100 mL premix        2 g 200 mL/hr over 30 Minutes Intravenous To [facility] 01/02/22 1537 01/02/22 1753       Subjective: Patient seen and examined at bedside today.  Hemodynamically stable.  Does not appear to be agitated today, lying in bed.  Obese commands.  Confused  Objective: Vitals:   01/03/22 1616 01/03/22 2119 01/03/22 2337 01/04/22 0849  BP: 133/72 139/74 (!) 142/76 132/81  Pulse: 68 65 77 96  Resp: 18 16 17 18   Temp: 98.3 F (36.8 C) 98.4 F (36.9 C)  99 F (37.2 C)  TempSrc: Oral Oral    SpO2: 100%  97% 100%    Intake/Output Summary (Last 24 hours) at 01/04/2022 1136 Last data filed at 01/04/2022 0856 Gross per 24 hour  Intake 145 ml  Output 300 ml  Net -155 ml   There were no vitals filed for this visit.  Examination:  General exam: Overall comfortable, not in distress, pleasantly confused HEENT: PERRL Respiratory system:  no wheezes or crackles  Cardiovascular system: S1 & S2 heard, RRR.  Gastrointestinal system: Abdomen is nondistended, soft and nontender. Central nervous system: Alert and awake , obeys commands, good strength in all extremities Extremities: Surgical wound on the right hip no edema, no clubbing ,no cyanosis Skin: No rashes, no ulcers,no icterus     Data Reviewed: I have personally reviewed following labs and imaging studies  CBC: Recent Labs  Lab  01/01/22 1955 01/02/22 0348 01/03/22 0339 01/04/22 0306  WBC 6.4 7.1 8.1 7.8  HGB 13.6 13.0 11.9* 11.3*  HCT 40.3 40.2 36.9* 33.4*  MCV 91.4 93.3 92.0 89.8  PLT 126* 129* 107* 97*   Basic Metabolic Panel: Recent Labs  Lab 01/01/22 1955 01/02/22 0348 01/03/22 0339  NA 137 139 135  K 3.2* 3.5 3.7  CL 102 102 102  CO2 26 27 25   GLUCOSE 134* 129* 112*  BUN 12 12 15   CREATININE 0.95 0.94 1.10  CALCIUM 8.5* 8.7* 8.4*  MG  --  1.9  --      Recent Results (from the past 240 hour(s))  Surgical pcr screen     Status: None   Collection Time: 01/02/22  3:40 PM   Specimen: Nasal Mucosa; Nasal Swab  Result Value Ref Range Status   MRSA, PCR NEGATIVE NEGATIVE Final   Staphylococcus aureus NEGATIVE NEGATIVE Final    Comment: (NOTE) The Xpert SA Assay (FDA approved for NASAL specimens in patients 3 years of age and older), is one component of a comprehensive surveillance program. It is not intended to diagnose infection nor to guide or monitor treatment. Performed at [facility], [place] [address], [place], [place] [address]      Radiology Studies: DG Pelvis Portable  Result Date: 01/03/2022 CLINICAL DATA:  RIGHT hip replacement surgery yesterday. EXAM: PORTABLE PELVIS 1-2 VIEWS COMPARISON:  Plain film of the pelvis dated 01/02/2022 FINDINGS: Interval RIGHT hip arthroplasty. Hardware appears intact and appropriately positioned. Expected postsurgical changes within the overlying soft tissues. IMPRESSION: Interval RIGHT hip arthroplasty. No evidence of surgical complicating feature. Electronically Signed   By: Franki Cabot M.D.   On: 01/03/2022 07:41   DG HIP UNILAT WITH PELVIS 1V RIGHT  Result Date: 01/02/2022 CLINICAL DATA:  Right femoral neck fracture EXAM: DG HIP (WITH OR WITHOUT PELVIS) 1V RIGHT COMPARISON:  Film from earlier in the same day. FLUOROSCOPY TIME:  Radiation Exposure Index (as provided by the fluoroscopic device): 0.61 mGy If the device does not provide the  exposure index: Fluoroscopy Time:  8 seconds Number of Acquired Images:  4 FINDINGS: Initial images demonstrate a spacer in the proximal femur. The femoral neck and femoral head of been removed. Subsequent placement of the prosthesis is noted in satisfactory position. No acute bony or soft tissue abnormality is noted. IMPRESSION: Right hip replacement Electronically Signed   By: Inez Catalina M.D.   On: 01/02/2022 19:22   DG C-Arm 1-60 Min-No Report  Result Date: 01/02/2022 Fluoroscopy was utilized by the requesting physician.  No radiographic interpretation.    Scheduled Meds:  aspirin  81 mg Oral BID WC   docusate sodium  100 mg Oral BID   feeding supplement  237 mL Oral BID BM   multivitamin with minerals  1 tablet Oral Daily   QUEtiapine  25 mg Oral QHS   senna  1 tablet Oral BID   Continuous Infusions:  methocarbamol (ROBAXIN) IV       LOS: 3 days   Shelly Coss, MD [facility] P10/07/2021, 11:36 AM

## 2022-01-04 NOTE — Plan of Care (Signed)
Stroke RN was called for eval. RN reached out to me-question regarding stroke activation.  I recommended no activation. LKW unknown.  Recent surgery rendering unable to use IV TNKase.  No LVO signs. Patient is S/P rt hip arthroplasty Question right facial droop and right-sided weakness I did not notice any right upper extremity weakness.  He is unable to move the right leg due to the surgery His right face appears mildly asymmetric but he says that he has asymmetric facial appearance at baseline. That said, I with the primary physician who said that he does appear somewhat confused since yesterday. I recommended an MRI of the brain to rule out embolic infarcts If the MRI shows a stroke, please call us back for a formal consultation.  Plan discussed with Dr. Tawanna Solo  -- Amie Portland, MD Neurologist [facility] Pager: [phone]

## 2022-01-04 NOTE — Progress Notes (Signed)
    Subjective:  Patient has reported memory issues at baseline. He is oriented to self this morning.  Examination limited due to cognitive issues.  Patient denies pain.  Denies N/V/CP/SOB/Abd pain.   Objective:   VITALS:   Vitals:   01/03/22 0900 01/03/22 1616 01/03/22 2119 01/03/22 2337  BP: 139/83 133/72 139/74 (!) 142/76  Pulse: 70 68 65 77  Resp: 18 18 16 17   Temp: 98.2 F (36.8 C) 98.3 F (36.8 C) 98.4 F (36.9 C)   TempSrc: Oral Oral Oral   SpO2: 98% 100%  97%    Patient sleeping in bed this morning. Woken up for exam. Has mittens on this morning. NAD.  ABD soft Neurovascular intact Sensation intact distally Intact pulses distally Dorsiflexion/Plantar flexion intact Incision: dressing C/D/I No cellulitis present Compartment soft Patient states he can feel light touch, but unable to fully assess due to cognitive impairment. No wincing or facial grimacing during exam. Including will log roll of hip.   Lab Results  Component Value Date   WBC 7.8 01/04/2022   HGB 11.3 (L) 01/04/2022   HCT 33.4 (L) 01/04/2022   MCV 89.8 01/04/2022   PLT 97 (L) 01/04/2022   BMET    Component Value Date/Time   NA 135 01/03/2022 0339   NA 140 08/19/2017 1119   K 3.7 01/03/2022 0339   CL 102 01/03/2022 0339   CO2 25 01/03/2022 0339   GLUCOSE 112 (H) 01/03/2022 0339   BUN 15 01/03/2022 0339   BUN 11 08/19/2017 1119   CREATININE 1.10 01/03/2022 0339   CREATININE 1.14 (H) 09/28/2020 1008   CALCIUM 8.4 (L) 01/03/2022 0339   GFRNONAA >60 01/03/2022 0339     Assessment/Plan: 2 Days Post-Op   Principal Problem:   Fracture of femoral neck, right, closed ([place]) Active Problems:   Essential hypertension, benign   Chronic atrial fibrillation (HCC)   Delirium   WBAT with walker DVT ppx: Aspirin, SCDs, TEDS PO pain control PT/OT: Patient worked with PT yesterday. Continue today.  Dispo: Patient under care of the medical team, disposition per their recommendation. Pain  medication and DVT ppx printed in chart.    Charlott Rakes, PA-C 01/04/2022, 6:21 AM   [facility]  [place] [address], [address], [place], [place] [address] Phone: [phone] [email or web address] Facebook  [employer]

## 2022-01-04 NOTE — [place] FL2 (Signed)
[place] MEDICAID FL2 LEVEL OF CARE SCREENING TOOL     IDENTIFICATION  Patient Name: Noah Waters Birthdate: 09-06-1924 Sex: male Admission Date (Current Location): 01/01/2022  [facility] and [place] Number:  Producer, television/film/video and Address:  [facility] [place], [address] [place], [place] [address]      Provider Number: [id number]  Attending Physician Name and Address:  Burnadette Pop, MD  Relative Name and Phone Number:       Current Level of Care: Hospital Recommended Level of Care: Skilled Nursing Facility Prior Approval Number:    Date Approved/Denied:   PASRR Number: [id number]  Discharge Plan: SNF    Current Diagnoses: Patient Active Problem List   Diagnosis Date Noted   Fracture of femoral neck, right, closed (HCC) 01/01/2022   Delirium 01/01/2022   Chronic atrial fibrillation (HCC) 07/30/2016   Essential hypertension, benign 11/28/2012    Orientation RESPIRATION BLADDER Height & Weight     Self, Place (fluctuating orientation)  Normal External catheter Weight:   Height:     BEHAVIORAL SYMPTOMS/MOOD NEUROLOGICAL BOWEL NUTRITION STATUS      Continent    AMBULATORY STATUS COMMUNICATION OF NEEDS Skin   Extensive Assist Verbally Surgical wounds                       Personal Care Assistance Level of Assistance  Bathing, Feeding, Dressing Bathing Assistance: Limited assistance Feeding assistance: Limited assistance Dressing Assistance: Maximum assistance     Functional Limitations Info  Sight, Hearing, Speech Sight Info: Adequate Hearing Info: Adequate Speech Info: Adequate    SPECIAL CARE FACTORS FREQUENCY  PT (By licensed PT), OT (By licensed OT)                    Contractures Contractures Info: Not present    Additional Factors Info                  Current Medications (01/04/2022):  This is the current hospital active medication list Current Facility-Administered Medications  Medication Dose  Route Frequency Provider Last Rate Last Admin   acetaminophen (TYLENOL) tablet 650 mg  650 mg Oral Q6H PRN Samson Frederic, MD   650 mg at 01/03/22 2123   aspirin chewable tablet 81 mg  81 mg Oral BID WC Hill, Alain Honey, PA-C   81 mg at 01/04/22 6967   docusate sodium (COLACE) capsule 100 mg  100 mg Oral BID Samson Frederic, MD   100 mg at 01/04/22 0851   feeding supplement (ENSURE ENLIVE / ENSURE PLUS) liquid 237 mL  237 mL Oral BID BM Swinteck, Arlys John, MD   237 mL at 01/04/22 0857   haloperidol lactate (HALDOL) injection 2.5 mg  2.5 mg Intravenous Q6H PRN Burnadette Pop, MD   2.5 mg at 01/03/22 2338   HYDROcodone-acetaminophen (NORCO) 7.5-325 MG per tablet 1 tablet  1 tablet Oral Q4H PRN Swinteck, Arlys John, MD       HYDROcodone-acetaminophen (NORCO/VICODIN) 5-325 MG per tablet 1 tablet  1 tablet Oral Q4H PRN Swinteck, Arlys John, MD       menthol-cetylpyridinium (CEPACOL) lozenge 3 mg  1 lozenge Oral PRN Swinteck, Arlys John, MD       Or   phenol (CHLORASEPTIC) mouth spray 1 spray  1 spray Mouth/Throat PRN Swinteck, Arlys John, MD       methocarbamol (ROBAXIN) tablet 500 mg  500 mg Oral Q6H PRN Samson Frederic, MD       Or  methocarbamol (ROBAXIN) 500 mg in dextrose 5 % 50 mL IVPB  500 mg Intravenous Q6H PRN Swinteck, Aaron Edelman, MD       metoCLOPramide (REGLAN) tablet 5 mg  5 mg Oral Q8H PRN Patrecia Pour, MD       Or   metoCLOPramide (REGLAN) injection 5 mg  5 mg Intravenous Q8H PRN Patrecia Pour, MD       morphine (PF) 2 MG/ML injection 1 mg  1 mg Intravenous Q2H PRN Swinteck, Aaron Edelman, MD       multivitamin with minerals tablet 1 tablet  1 tablet Oral Daily Swinteck, Brian, MD   1 tablet at 01/04/22 0851   ondansetron (ZOFRAN) tablet 4 mg  4 mg Oral Q6H PRN Swinteck, Aaron Edelman, MD       Or   ondansetron (ZOFRAN) injection 4 mg  4 mg Intravenous Q6H PRN Swinteck, Aaron Edelman, MD       polyethylene glycol (MIRALAX / GLYCOLAX) packet 17 g  17 g Oral Daily PRN Swinteck, Aaron Edelman, MD       QUEtiapine (SEROQUEL) tablet 25 mg  25 mg  Oral QHS Shelly Coss, MD   25 mg at 01/03/22 2123   senna (SENOKOT) tablet 8.6 mg  1 tablet Oral BID Rod Can, MD   8.6 mg at 01/04/22 1610     Discharge Medications: Please see discharge summary for a list of discharge medications.  Relevant Imaging Results:  Relevant Lab Results:   Additional Information SS# [id number]  Amador Cunas, [place]

## 2022-01-04 NOTE — TOC Initial Note (Signed)
Transition of Care [facility]) - Initial/Assessment Note    Patient Details  Name: Noah Waters MRN: [id number] Date of Birth: 05-07-1924  Transition of Care [facility]) CM/SW Contact:    Amador Cunas, [place] Phone Number: 01/04/2022, 9:50 AM  Clinical Narrative:   Spoke to pt's dtr Evette [phone] re dc planning. Pt admitted from home alone, adult children check on him daily. Dtr aware of recommendation for SNF/STR and is agreeable. Reviewed SNF placement process and answered questions. Pt's dtr notes [facility] as preferred facility. SNF search started and [facility] auth request submitted (ref # [id number]).   Wandra Feinstein, MSW, LCSW [phone] (coverage)     Expected Discharge Plan: [place] Barriers to Discharge: Continued Medical Work up, SNF Pending bed offer, Insurance Authorization   Patient Goals and CMS Choice     Choice offered to / list presented to : Adult Children  Expected Discharge Plan and Services Expected Discharge Plan: [place] arrangements for the past 2 months: Single Family Home                                      Prior Living Arrangements/Services Living arrangements for the past 2 months: Single Family Home Lives with:: Self Patient language and need for interpreter reviewed:: No        Need for Family Participation in Patient Care: No (Comment)     Criminal Activity/Legal Involvement Pertinent to Current Situation/Hospitalization: No - Comment as needed  Activities of Daily Living Home Assistive Devices/Equipment: Environmental consultant (specify type), Cane (specify quad or straight), Dentures (specify type), Eyeglasses ADL Screening (condition at time of admission) Does the patient have difficulty seeing, even when wearing glasses/contacts?: No Does the patient have difficulty concentrating, remembering, or making decisions?: Yes Does the patient have difficulty dressing or bathing?:  Yes Independently performs ADLs?: Yes (appropriate for developmental age) Does the patient have difficulty walking or climbing stairs?: Yes  Permission Sought/Granted Permission sought to share information with : Chartered certified accountant granted to share information with : Yes, Verbal Permission Granted              Emotional Assessment       Orientation: : Fluctuating Orientation (Suspected and/or reported Sundowners), Oriented to Self Alcohol / Substance Use: Not Applicable Psych Involvement: No (comment)  Admission diagnosis:  Fracture of femoral neck, right, closed ([place]) [S72.001A] Closed fracture of neck of right femur, initial encounter ([place]) [S72.001A] Fall, initial encounter [W19.XXXA] Atrial fibrillation with controlled ventricular rate [facility]) [I48.91] Patient Active Problem List   Diagnosis Date Noted   Fracture of femoral neck, right, closed ([place]) 01/01/2022   Delirium 01/01/2022   Chronic atrial fibrillation ([place]) 07/30/2016   Essential hypertension, benign 11/28/2012   PCP:  Wardell Honour, MD Pharmacy:   [facility] [place], [place] [address] Phone: [phone] Fax: [phone]  [facility] ([place], [place] [facility] [place] [address] [address] Phone: [phone] Fax: [phone]  [id number] [place], [place] [address] [place] [address] Phone: [phone] Fax: [phone]     Social Determinants of Health (SDOH) Interventions    Readmission  Risk Interventions     No data to display

## 2022-01-04 NOTE — Progress Notes (Signed)
Asked to come assess this 88 year old gentleman for acute right lower extremity leg weakness per PT review. He is here for right hip replacement per RN. RN at bedside. Pt is hard of hearing and equally weak in his lower extremities on exam. He received Haldol at midnight overnight. Pt oriented to self but trouble answering other questions. This is baseline per RN. Neurologist notified and will come assess the patient at bedside. Last known well unclear but PT says this is new weakness compared to yesterday.   Joyleen Haselton, Rande Brunt, RN  Stroke Response Nurse  [phone]

## 2022-01-04 NOTE — Progress Notes (Signed)
Patient is back from MRI  

## 2022-01-05 NOTE — TOC Progression Note (Addendum)
Transition of Care [facility]) - Progression Note    Patient Details  Name: Noah Waters MRN: [id number] Date of Birth: 28-Jul-1924  Transition of Care [facility]) CM/SW Contact  Noah Chars, LCSW Phone Number: 01/05/2022, 12:19 PM  Clinical Narrative:  CSW spoke with pt daughter Noah Waters, she is out of town, asked CSW to call daughter Noah Waters.  CSW spoke with Noah Waters, provided bed offers, she will discuss with family and call back with choice.   1345: CSW spoke with Noah Waters, she is requesting response from [facility].  CSW spoke with Noah Waters/[facility], no beds available, Noah Waters informed. She also asked about LTC options.  Family has not looked into this at all yet.  Discussed pricing for LTC at SNF-pt does not have payer.  She will make SNF determination by Saturday, also wants to look at ALF options.   Choice document placed in pt room along with  ALF listing.    Expected Discharge Plan: [place] Barriers to Discharge: Continued Medical Work up, SNF Pending bed offer, Ship broker  Expected Discharge Plan and Services Expected Discharge Plan: [place] arrangements for the past 2 months: Single Family Home                                       Social Determinants of Health (SDOH) Interventions    Readmission Risk Interventions     No data to display

## 2022-01-05 NOTE — Plan of Care (Signed)
  Problem: Clinical Measurements: Goal: Will remain free from infection Outcome: Progressing   Problem: Safety: Goal: Ability to remain free from injury will improve Outcome: Progressing   

## 2022-01-05 NOTE — Plan of Care (Signed)
MR brain negative. No further recs at this  time.  -- Amie [place], MD Neurologist [facility] Pager: [phone]

## 2022-01-05 NOTE — Progress Notes (Signed)
PROGRESS NOTE  Noah Waters  [id number] DOB: June 23, 1924 DOA: 01/01/2022 PCP: Wardell Honour, MD   Brief Narrative:  Patient is a 88 year old male with history of chronic A-fib not on anticoagulation, hypertension who presented from home after she fell resulting in right hip pain.  Imaging showed right femoral neck fracture, orthopedics consulted, status post hemiarthroplasty on 10/3.  PT/OT recommending SNF.  Hospital course remarkable for agitation, delirium.  Assessment & Plan:  Principal Problem:   Fracture of femoral neck, right, closed ([place]) Active Problems:   Delirium   Essential hypertension, benign   Chronic atrial fibrillation ([place])  Right femoral neck fracture: Fell at home.  Presented with severe right hip pain.  Imaging showed right femoral neck fracture.  Status post right hemiarthroplasty on 10/3.   Continue pain management supportive care, bowel regimen.  On aspirin for DVT prophylaxis. PT/OT recommending SNF.  TOC following  Delirium/dementia:  Patient is confused. He could have developed delirium.  Has memory issues,confused at baseline as per daughter.  Continue delirium precautions, frequent reorientation.  Minimize narcotics, sedatives. Started on Seroquel at bedtime .  He looks confused this morning. There was concern for stroke from PT saying that he is weak on his right side.  He does not have any upper extremity weakness.  He has questionable right facial droop.   no evidence of a stroke as per MRI  Chronic A-fib: Not on anticoagulation.  Currently rate is controlled.  Hypertension: Not on home medications.  Continue to monitor.  Thrombocytopenia: Continue to monitor  Hypokalemia: Supplemented and corrected  Normocytic anemia: Hemoglobin stable    Nutrition Problem: Increased nutrient needs Etiology: hip fracture, post-op healing    DVT prophylaxis:SCDs Start: 01/02/22 2133     Code Status: DNR  Family Communication: Called and discussed  with daughter on phone on 10/5  Patient status:Inpatient  Patient is from :Home  Anticipated discharge to:SNF  Estimated DC date:whenever possible   Consultants: Orthopedics  Procedures:ORIF  Antimicrobials:  Anti-infectives (From admission, onward)    Start     Dose/Rate Route Frequency Ordered Stop   01/03/22 0200  ceFAZolin (ANCEF) IVPB 2g/100 mL premix        2 g 200 mL/hr over 30 Minutes Intravenous Every 8 hours 01/02/22 2132 01/04/22 0856   01/02/22 1545  ceFAZolin (ANCEF) IVPB 2g/100 mL premix        2 g 200 mL/hr over 30 Minutes Intravenous To [facility] 01/02/22 1537 01/02/22 1753       Subjective: Patient seen and examined at bedside today. Looks  confused, lying in bed, on mittens.   Objective: Vitals:   01/04/22 0849 01/04/22 1234 01/04/22 2006 01/05/22 1002  BP: 132/81 (!) 123/58 (!) 156/82 135/80  Pulse: 96 66 83 89  Resp: 18 18  17   Temp: 99 F (37.2 C) 99 F (37.2 C)  98.3 F (36.8 C)  TempSrc:    Oral  SpO2: 100% 98% 98% 98%    Intake/Output Summary (Last 24 hours) at 01/05/2022 1152 Last data filed at 01/05/2022 0900 Gross per 24 hour  Intake 225 ml  Output 150 ml  Net 75 ml   There were no vitals filed for this visit.  Examination:  General exam: Confused, lying in bed, mittens HEENT: PERRL Respiratory system:  no wheezes or crackles  Cardiovascular system: S1 & S2 heard, RRR.  Gastrointestinal system: Abdomen is nondistended, soft and nontender. Central nervous system: Not alert or oriented today.   Extremities: Surgical wound  on the right hip, no edema, no clubbing ,no cyanosis Skin: No rashes, no ulcers,no icterus     Data Reviewed: I have personally reviewed following labs and imaging studies  CBC: Recent Labs  Lab 01/01/22 1955 01/02/22 0348 01/03/22 0339 01/04/22 0306  WBC 6.4 7.1 8.1 7.8  HGB 13.6 13.0 11.9* 11.3*  HCT 40.3 40.2 36.9* 33.4*  MCV 91.4 93.3 92.0 89.8  PLT 126* 129* 107* 97*   Basic  Metabolic Panel: Recent Labs  Lab 01/01/22 1955 01/02/22 0348 01/03/22 0339  NA 137 139 135  K 3.2* 3.5 3.7  CL 102 102 102  CO2 26 27 25   GLUCOSE 134* 129* 112*  BUN 12 12 15   CREATININE 0.95 0.94 1.10  CALCIUM 8.5* 8.7* 8.4*  MG  --  1.9  --      Recent Results (from the past 240 hour(s))  Surgical pcr screen     Status: None   Collection Time: 01/02/22  3:40 PM   Specimen: Nasal Mucosa; Nasal Swab  Result Value Ref Range Status   MRSA, PCR NEGATIVE NEGATIVE Final   Staphylococcus aureus NEGATIVE NEGATIVE Final    Comment: (NOTE) The Xpert SA Assay (FDA approved for NASAL specimens in patients 82 years of age and older), is one component of a comprehensive surveillance program. It is not intended to diagnose infection nor to guide or monitor treatment. Performed at [facility], [place] [address], [place], [place] [address]      Radiology Studies: MR BRAIN WO CONTRAST  Result Date: 01/05/2022 CLINICAL DATA:  Initial evaluation for neuro deficit, stroke suspected. EXAM: MRI HEAD WITHOUT CONTRAST TECHNIQUE: Multiplanar, multiecho pulse sequences of the brain and surrounding structures were obtained without intravenous contrast. COMPARISON:  Prior CT from 01/01/2022. FINDINGS: Brain: Diffuse prominence of the CSF containing spaces compatible generalized cerebral atrophy. Patchy and confluent T2/FLAIR hyperintensity involving the periventricular and deep white matter both cerebral hemispheres as well as the pons, most consistent with chronic small vessel ischemic disease, moderately advanced for age. Small remote left cerebellar infarct noted. No evidence for acute or subacute ischemia. Gray-white matter differentiation maintained. No other areas of chronic cortical infarction. No acute or chronic intracranial blood products. No mass lesion, midline shift or mass effect. Diffuse ventricular prominence related to global parenchymal volume loss of hydrocephalus. No extra-axial  fluid collection. Empty sella noted. Vascular: Heterogeneous flow void within the intradural left V4 segment, which could be related to slow flow and/or occlusion. Major intracranial vascular flow voids are otherwise maintained. Skull and upper cervical spine: Craniocervical junction within normal limits. Moderate spondylosis noted within the visualized upper cervical spine. No focal marrow replacing lesion. Small lipoma noted at the right frontal scalp. Sinuses/Orbits: Prior bilateral ocular lens replacement. Paranasal sinuses are largely clear. No significant mastoid effusion. Other: None. IMPRESSION: 1. No acute intracranial abnormality. 2. Moderately advanced cerebral atrophy with chronic small vessel ischemic disease. Small remote left cerebellar infarct. 3. Heterogeneous flow void within the intradural left V4 segment, which could be related to slow flow and/or occlusion. Electronically Signed   By: Jeannine Boga M.D.   On: 01/05/2022 00:40    Scheduled Meds:  aspirin  81 mg Oral BID WC   docusate sodium  100 mg Oral BID   feeding supplement  237 mL Oral BID BM   multivitamin with minerals  1 tablet Oral Daily   QUEtiapine  25 mg Oral QHS   senna  1 tablet Oral BID   Continuous Infusions:  methocarbamol (ROBAXIN) IV       LOS: 4 days   Shelly Coss, MD [facility] P10/08/2021, 11:52 AM

## 2022-01-05 NOTE — Progress Notes (Signed)
PT Cancellation Note  Patient Details Name: Noah Waters MRN: [id number] DOB: 1924-08-24   Cancelled Treatment:    Reason Eval/Treat Not Completed: Other (comment).  Refused therapy despite giving pt options, and will retry at another time.   Ramond Dial 01/05/2022, 4:51 PM  Mee Hives, PT PhD Acute Rehab Dept. Number: [place]

## 2022-01-06 NOTE — TOC Progression Note (Signed)
Transition of Care [facility]) - Progression Note    Patient Details  Name: Noah Waters MRN: [id number] Date of Birth: Aug 21, 1924  Transition of Care [facility]) CM/SW Contact  Emeterio Reeve, [place] Phone Number: 01/06/2022, 4:21 PM  Clinical Narrative:     CSW spoke to pts daughter at bedside. Daughter chose [facility]. Insurance Josem Kaufmann will need to be started when pt is medically stable for DC.  Expected Discharge Plan: [place] Barriers to Discharge: Continued Medical Work up, SNF Pending bed offer, Ship broker  Expected Discharge Plan and Services Expected Discharge Plan: [place] arrangements for the past 2 months: Single Family Home                                       Social Determinants of Health (SDOH) Interventions    Readmission Risk Interventions     No data to display         Emeterio Reeve, [place] Social Worker

## 2022-01-06 NOTE — Progress Notes (Signed)
PROGRESS NOTE  Noah Waters  NOB:[id number] DOB: 20-May-1924 DOA: 01/01/2022 PCP: Wardell Honour, MD   Brief Narrative:  Patient is a 88 year old male with history of chronic A-fib not on anticoagulation, hypertension who presented from home after she fell resulting in right hip pain.  Imaging showed right femoral neck fracture, orthopedics consulted, status post hemiarthroplasty on 10/3.  PT/OT recommending SNF.  Hospital course remarkable for agitation, delirium now improved.  Medically stable for discharge to skilled nursing facility whenever possible  Assessment & Plan:  Principal Problem:   Fracture of femoral neck, right, closed ([place]) Active Problems:   Delirium   Essential hypertension, benign   Chronic atrial fibrillation ([place])  Right femoral neck fracture: Fell at home.  Presented with severe right hip pain.  Imaging showed right femoral neck fracture.  Status post right hemiarthroplasty on 10/3.   Continue pain management supportive care, bowel regimen.  On aspirin for DVT prophylaxis. PT/OT recommending SNF.  TOC following  Delirium/dementia:  Patient was confused,agitated now more calm. Likely hospital acquired  delirium.  Has memory issues,confused at baseline as per daughter.  Continue delirium precautions, frequent reorientation.  Minimize narcotics, sedatives. Started on Seroquel at bedtime .There was concern for stroke from PT saying that he is weak on his right side.  He does not have any upper extremity weakness.  He has questionable right facial droop.   No evidence of a stroke as per MRI  Chronic A-fib: Not on anticoagulation.  Currently rate is controlled.  Hypertension: Not on home medications.  Continue to monitor.  Thrombocytopenia: Continue to monitor  Hypokalemia: Supplemented and corrected  Normocytic anemia: Hemoglobin stable    Nutrition Problem: Increased nutrient needs Etiology: hip fracture, post-op healing    DVT prophylaxis:SCDs Start:  01/02/22 2133     Code Status: DNR  Family Communication: Discussed with daughter at bedside on bedside at 10/7  Patient status:Inpatient  Patient is from :Home  Anticipated discharge to:SNF  Estimated DC date:whenever possible   Consultants: Orthopedics  Procedures:ORIF  Antimicrobials:  Anti-infectives (From admission, onward)    Start     Dose/Rate Route Frequency Ordered Stop   01/03/22 0200  ceFAZolin (ANCEF) IVPB 2g/100 mL premix        2 g 200 mL/hr over 30 Minutes Intravenous Every 8 hours 01/02/22 2132 01/04/22 0856   01/02/22 1545  ceFAZolin (ANCEF) IVPB 2g/100 mL premix        2 g 200 mL/hr over 30 Minutes Intravenous To [facility] 01/02/22 1537 01/02/22 1753       Subjective: Patient seen and examined the bedside today.  Hemodynamically stable.  Lying in bed.  Alert, awake, communicates well, not oriented to time.  Daughter at bedside.  Not agitated.   Objective: Vitals:   01/05/22 1709 01/05/22 1930 01/06/22 0335 01/06/22 0700  BP: (!) 157/117 (!) 141/76 127/83 (!) 139/99  Pulse: (!) 101 73 69 70  Resp: 18 18 18 18   Temp: 98.3 F (36.8 C) 99.1 F (37.3 C) 99 F (37.2 C) 98 F (36.7 C)  TempSrc:  Oral  Oral  SpO2: 93% 98% 97% 99%    Intake/Output Summary (Last 24 hours) at 01/06/2022 1125 Last data filed at 01/06/2022 0842 Gross per 24 hour  Intake 200 ml  Output 400 ml  Net -200 ml   There were no vitals filed for this visit.  Examination: General exam: Overall comfortable, not in distress, pleasantly confused elderly male HEENT: PERRL Respiratory system:  no wheezes  or crackles  Cardiovascular system: S1 & S2 heard, RRR.  Gastrointestinal system: Abdomen is nondistended, soft and nontender. Central nervous system: Alert and awake, oriented to place Extremities: Surgical wound on the right hip.  No edema, no clubbing ,no cyanosis Skin: No rashes, no ulcers,no icterus     Data Reviewed: I have personally reviewed following  labs and imaging studies  CBC: Recent Labs  Lab 01/01/22 1955 01/02/22 0348 01/03/22 0339 01/04/22 0306  WBC 6.4 7.1 8.1 7.8  HGB 13.6 13.0 11.9* 11.3*  HCT 40.3 40.2 36.9* 33.4*  MCV 91.4 93.3 92.0 89.8  PLT 126* 129* 107* 97*   Basic Metabolic Panel: Recent Labs  Lab 01/01/22 1955 01/02/22 0348 01/03/22 0339  NA 137 139 135  K 3.2* 3.5 3.7  CL 102 102 102  CO2 26 27 25   GLUCOSE 134* 129* 112*  BUN 12 12 15   CREATININE 0.95 0.94 1.10  CALCIUM 8.5* 8.7* 8.4*  MG  --  1.9  --      Recent Results (from the past 240 hour(s))  Surgical pcr screen     Status: None   Collection Time: 01/02/22  3:40 PM   Specimen: Nasal Mucosa; Nasal Swab  Result Value Ref Range Status   MRSA, PCR NEGATIVE NEGATIVE Final   Staphylococcus aureus NEGATIVE NEGATIVE Final    Comment: (NOTE) The Xpert SA Assay (FDA approved for NASAL specimens in patients 62 years of age and older), is one component of a comprehensive surveillance program. It is not intended to diagnose infection nor to guide or monitor treatment. Performed at [facility], [place] [address] [place], [place] [address]      Radiology Studies: MR BRAIN WO CONTRAST  Result Date: 01/05/2022 CLINICAL DATA:  Initial evaluation for neuro deficit, stroke suspected. EXAM: MRI HEAD WITHOUT CONTRAST TECHNIQUE: Multiplanar, multiecho pulse sequences of the brain and surrounding structures were obtained without intravenous contrast. COMPARISON:  Prior CT from 01/01/2022. FINDINGS: Brain: Diffuse prominence of the CSF containing spaces compatible generalized cerebral atrophy. Patchy and confluent T2/FLAIR hyperintensity involving the periventricular and deep white matter both cerebral hemispheres as well as the pons, most consistent with chronic small vessel ischemic disease, moderately advanced for age. Small remote left cerebellar infarct noted. No evidence for acute or subacute ischemia. Gray-white matter differentiation maintained.  No other areas of chronic cortical infarction. No acute or chronic intracranial blood products. No mass lesion, midline shift or mass effect. Diffuse ventricular prominence related to global parenchymal volume loss of hydrocephalus. No extra-axial fluid collection. Empty sella noted. Vascular: Heterogeneous flow void within the intradural left V4 segment, which could be related to slow flow and/or occlusion. Major intracranial vascular flow voids are otherwise maintained. Skull and upper cervical spine: Craniocervical junction within normal limits. Moderate spondylosis noted within the visualized upper cervical spine. No focal marrow replacing lesion. Small lipoma noted at the right frontal scalp. Sinuses/Orbits: Prior bilateral ocular lens replacement. Paranasal sinuses are largely clear. No significant mastoid effusion. Other: None. IMPRESSION: 1. No acute intracranial abnormality. 2. Moderately advanced cerebral atrophy with chronic small vessel ischemic disease. Small remote left cerebellar infarct. 3. Heterogeneous flow void within the intradural left V4 segment, which could be related to slow flow and/or occlusion. Electronically Signed   By: Jeannine Boga M.D.   On: 01/05/2022 00:40    Scheduled Meds:  aspirin  81 mg Oral BID WC   docusate sodium  100 mg Oral BID   feeding supplement  237 mL Oral  BID BM   multivitamin with minerals  1 tablet Oral Daily   QUEtiapine  25 mg Oral QHS   senna  1 tablet Oral BID   Continuous Infusions:  methocarbamol (ROBAXIN) IV       LOS: 5 days   Shelly Coss, MD [facility] P10/09/2021, 11:25 AM

## 2022-01-07 LAB — CBC
HCT: 34.4 % — ABNORMAL LOW (ref 39.0–52.0)
Hemoglobin: 11.6 g/dL — ABNORMAL LOW (ref 13.0–17.0)
MCH: 30.3 pg (ref 26.0–34.0)
MCHC: 33.7 g/dL (ref 30.0–36.0)
MCV: 89.8 fL (ref 80.0–100.0)
Platelets: 143 10*3/uL — ABNORMAL LOW (ref 150–400)
RBC: 3.83 MIL/uL — ABNORMAL LOW (ref 4.22–5.81)
RDW: 13.8 % (ref 11.5–15.5)
WBC: 6.4 10*3/uL (ref 4.0–10.5)
nRBC: 0 % (ref 0.0–0.2)

## 2022-01-07 LAB — BASIC METABOLIC PANEL
Anion gap: 7 (ref 5–15)
BUN: 20 mg/dL (ref 8–23)
CO2: 26 mmol/L (ref 22–32)
Calcium: 8.3 mg/dL — ABNORMAL LOW (ref 8.9–10.3)
Chloride: 104 mmol/L (ref 98–111)
Creatinine, Ser: 0.83 mg/dL (ref 0.61–1.24)
GFR, Estimated: >60 mL/min (ref >60)
Glucose, Bld: 90 mg/dL (ref 70–99)
Potassium: 3.8 mmol/L (ref 3.5–5.1)
Sodium: 137 mmol/L (ref 135–145)

## 2022-01-07 MED ORDER — POLYETHYLENE GLYCOL 3350 17 G PO PACK
17.0000 g | PACK | Freq: Every day | ORAL | Status: DC
  Administered 2022-01-07 – 2022-01-08 (×2): 17 g via ORAL
  Filled 2022-01-07 (×2): qty 1

## 2022-01-07 NOTE — TOC Progression Note (Signed)
Transition of Care [facility]) - Progression Note    Patient Details  Name: Noah Waters MRN: [id number] Date of Birth: 07/03/1924  Transition of Care [facility]) CM/SW Contact  Ileane Sando, [place], [place] Phone Number: 01/07/2022, 2:37 PM  Clinical Narrative:     Phone call to [employer] to start authorization and was informed that the authorization has already been started. SNF selection provided. Patient approved from 01/05/22-01/09/22. Next review date 01/09/22 Fax number to to fax continued stay [phone]. Ref# [id number].  Carlicia Leavens, LCSW Clinical Social Worker  Transitions of Care [phone]   Expected Discharge Plan: Skilled Nursing Facility Barriers to Discharge: Continued Medical Work up, SNF Pending bed offer, Ship broker  Expected Discharge Plan and Services Expected Discharge Plan: [place] arrangements for the past 2 months: Single Family Home                                       Social Determinants of Health (SDOH) Interventions    Readmission Risk Interventions     No data to display

## 2022-01-07 NOTE — Progress Notes (Signed)
PROGRESS NOTE  Noah Waters  OZH:[id number] DOB: 01/02/1925 DOA: 01/01/2022 PCP: Frederica KusterMiller, Stephen M, MD   Brief Narrative:  Patient is a 88 year old male with history of chronic A-fib not on anticoagulation, hypertension who presented from home after she fell resulting in right hip pain.  Imaging showed right femoral neck fracture, orthopedics consulted, status post hemiarthroplasty on 10/3.  PT/OT recommending SNF.  Hospital course remarkable for agitation, delirium now improved.  Medically stable for discharge to skilled nursing facility whenever possible  Assessment & Plan:  Principal Problem:   Fracture of femoral neck, right, closed ([facility]) Active Problems:   Delirium   Essential hypertension, benign   Chronic atrial fibrillation ([facility])  Right femoral neck fracture: Fell at home.  Presented with severe right hip pain.  Imaging showed right femoral neck fracture.  Status post right hemiarthroplasty on 10/3.   Continue pain management supportive care, bowel regimen.  On aspirin for DVT prophylaxis. PT/OT recommending SNF.  TOC following  Delirium/dementia:  Patient was confused,agitated now more calm. Likely hospital acquired  delirium.  Has memory issues,confused at baseline as per daughter.  Continue delirium precautions, frequent reorientation.  Minimize narcotics, sedatives. Started on Seroquel at bedtime .There was concern for stroke from PT saying that he is weak on his right side.  He does not have any upper extremity weakness.  He has questionable right facial droop.   No evidence of a stroke as per MRI  Chronic A-fib: Not on anticoagulation.  Currently rate is controlled.  Hypertension: Not on home medications.  Continue to monitor.  Thrombocytopenia: Continue to monitor  Hypokalemia: Supplemented and corrected  Normocytic anemia: Hemoglobin stable    Nutrition Problem: Increased nutrient needs Etiology: hip fracture, post-op healing    DVT prophylaxis:SCDs Start:  01/02/22 2133     Code Status: DNR  Family Communication: Discussed with daughter at bedside on bedside at 10/7  Patient status:Inpatient  Patient is from :Home  Anticipated discharge to:SNF  Estimated DC date:whenever possible   Consultants: Orthopedics  Procedures:ORIF  Antimicrobials:  Anti-infectives (From admission, onward)    Start     Dose/Rate Route Frequency Ordered Stop   01/03/22 0200  ceFAZolin (ANCEF) IVPB 2g/100 mL premix        2 g 200 mL/hr over 30 Minutes Intravenous Every 8 hours 01/02/22 2132 01/04/22 0856   01/02/22 1545  ceFAZolin (ANCEF) IVPB 2g/100 mL premix        2 g 200 mL/hr over 30 Minutes [facility] 01/02/22 1537 01/02/22 1753       Subjective: Patient seen and examined the bedside today.  Hemodynamically stable.  Lying in bed.  Confused but not agitated today.  Denies any complaints.   Objective: Vitals:   01/06/22 0700 01/06/22 2013 01/06/22 2045 01/07/22 0539  BP: (!) 139/99  119/80 (!) 154/91  Pulse: 70  96 69  Resp: 18  17 16   Temp: 98 F (36.7 C)  97.9 F (36.6 C) 98.6 F (37 C)  TempSrc: Oral     SpO2: 99%  100%   Weight:  68.5 kg    Height:  6\' 2"  (1.88 m)      Intake/Output Summary (Last 24 hours) at 01/07/2022 1114 Last data filed at 01/06/2022 1500 Gross per 24 hour  Intake 100 ml  Output 400 ml  Net -300 ml   Filed Weights   01/06/22 2013  Weight: 68.5 kg    Examination: General exam: Overall comfortable, not in distress HEENT: PERRL  Respiratory system:  no wheezes or crackles  Cardiovascular system: S1 & S2 heard, RRR.  Gastrointestinal system: Abdomen is nondistended, soft and nontender. Central nervous system: Alert and awake,obeys commands but not  oriented Extremities: No edema, no clubbing ,no cyanosis, surgical wound on the right hip Skin: No rashes, no ulcers,no icterus       Data Reviewed: I have personally reviewed following labs and imaging studies  CBC: Recent Labs   Lab 01/01/22 1955 01/02/22 0348 01/03/22 0339 01/04/22 0306 01/07/22 0326  WBC 6.4 7.1 8.1 7.8 6.4  HGB 13.6 13.0 11.9* 11.3* 11.6*  HCT 40.3 40.2 36.9* 33.4* 34.4*  MCV 91.4 93.3 92.0 89.8 89.8  PLT 126* 129* 107* 97* 161*   Basic Metabolic Panel: Recent Labs  Lab 01/01/22 1955 01/02/22 0348 01/03/22 0339 01/07/22 0326  NA 137 139 135 137  K 3.2* 3.5 3.7 3.8  CL 102 102 102 104  CO2 26 27 25 26   GLUCOSE 134* 129* 112* 90  BUN 12 12 15 20   CREATININE 0.95 0.94 1.10 0.83  CALCIUM 8.5* 8.7* 8.4* 8.3*  MG  --  1.9  --   --      Recent Results (from the past 240 hour(s))  Surgical pcr screen     Status: None   Collection Time: 01/02/22  3:40 PM   Specimen: Nasal Mucosa; Nasal Swab  Result Value Ref Range Status   MRSA, PCR NEGATIVE NEGATIVE Final   Staphylococcus aureus NEGATIVE NEGATIVE Final    Comment: (NOTE) The Xpert SA Assay (FDA approved for NASAL specimens in patients 65 years of age and older), is one component of a comprehensive surveillance program. It is not intended to diagnose infection nor to guide or monitor treatment. Performed at [facility], [place] [address], [place], [place] [address]      Radiology Studies: No results found.  Scheduled Meds:  aspirin  81 mg Oral BID WC   docusate sodium  100 mg Oral BID   feeding supplement  237 mL Oral BID BM   multivitamin with minerals  1 tablet Oral Daily   QUEtiapine  25 mg Oral QHS   senna  1 tablet Oral BID   Continuous Infusions:  methocarbamol (ROBAXIN) IV       LOS: 6 days   Shelly Coss, MD [facility] P10/10/2021, 11:14 AM

## 2022-01-08 DIAGNOSIS — S199XXA Unspecified injury of neck, initial encounter: Secondary | ICD-10-CM | POA: Diagnosis not present

## 2022-01-08 DIAGNOSIS — S72031D Displaced midcervical fracture of right femur, subsequent encounter for closed fracture with routine healing: Secondary | ICD-10-CM | POA: Diagnosis not present

## 2022-01-08 DIAGNOSIS — M533 Sacrococcygeal disorders, not elsewhere classified: Secondary | ICD-10-CM | POA: Diagnosis not present

## 2022-01-08 DIAGNOSIS — D649 Anemia, unspecified: Secondary | ICD-10-CM | POA: Diagnosis not present

## 2022-01-08 DIAGNOSIS — M6281 Muscle weakness (generalized): Secondary | ICD-10-CM | POA: Diagnosis not present

## 2022-01-08 DIAGNOSIS — G9389 Other specified disorders of brain: Secondary | ICD-10-CM | POA: Diagnosis not present

## 2022-01-08 DIAGNOSIS — Z471 Aftercare following joint replacement surgery: Secondary | ICD-10-CM | POA: Diagnosis not present

## 2022-01-08 DIAGNOSIS — Z7982 Long term (current) use of aspirin: Secondary | ICD-10-CM | POA: Diagnosis not present

## 2022-01-08 DIAGNOSIS — F0390 Unspecified dementia without behavioral disturbance: Secondary | ICD-10-CM | POA: Diagnosis not present

## 2022-01-08 DIAGNOSIS — W19XXXA Unspecified fall, initial encounter: Secondary | ICD-10-CM | POA: Diagnosis not present

## 2022-01-08 DIAGNOSIS — Z743 Need for continuous supervision: Secondary | ICD-10-CM | POA: Diagnosis not present

## 2022-01-08 DIAGNOSIS — J9859 Other diseases of mediastinum, not elsewhere classified: Secondary | ICD-10-CM | POA: Diagnosis not present

## 2022-01-08 DIAGNOSIS — M25551 Pain in right hip: Secondary | ICD-10-CM | POA: Diagnosis not present

## 2022-01-08 DIAGNOSIS — R296 Repeated falls: Secondary | ICD-10-CM | POA: Diagnosis not present

## 2022-01-08 DIAGNOSIS — R1311 Dysphagia, oral phase: Secondary | ICD-10-CM | POA: Diagnosis not present

## 2022-01-08 DIAGNOSIS — R262 Difficulty in walking, not elsewhere classified: Secondary | ICD-10-CM | POA: Diagnosis not present

## 2022-01-08 DIAGNOSIS — R531 Weakness: Secondary | ICD-10-CM | POA: Diagnosis not present

## 2022-01-08 DIAGNOSIS — I6782 Cerebral ischemia: Secondary | ICD-10-CM | POA: Diagnosis not present

## 2022-01-08 DIAGNOSIS — I10 Essential (primary) hypertension: Secondary | ICD-10-CM | POA: Diagnosis not present

## 2022-01-08 DIAGNOSIS — I6523 Occlusion and stenosis of bilateral carotid arteries: Secondary | ICD-10-CM | POA: Diagnosis not present

## 2022-01-08 DIAGNOSIS — S72001A Fracture of unspecified part of neck of right femur, initial encounter for closed fracture: Secondary | ICD-10-CM | POA: Diagnosis not present

## 2022-01-08 DIAGNOSIS — Z79899 Other long term (current) drug therapy: Secondary | ICD-10-CM | POA: Diagnosis not present

## 2022-01-08 DIAGNOSIS — R6889 Other general symptoms and signs: Secondary | ICD-10-CM | POA: Diagnosis not present

## 2022-01-08 DIAGNOSIS — R2681 Unsteadiness on feet: Secondary | ICD-10-CM | POA: Diagnosis not present

## 2022-01-08 DIAGNOSIS — Z7401 Bed confinement status: Secondary | ICD-10-CM | POA: Diagnosis not present

## 2022-01-08 DIAGNOSIS — R410 Disorientation, unspecified: Secondary | ICD-10-CM | POA: Diagnosis not present

## 2022-01-08 DIAGNOSIS — Z9181 History of falling: Secondary | ICD-10-CM | POA: Diagnosis not present

## 2022-01-08 DIAGNOSIS — S72001S Fracture of unspecified part of neck of right femur, sequela: Secondary | ICD-10-CM | POA: Diagnosis not present

## 2022-01-08 DIAGNOSIS — L7634 Postprocedural seroma of skin and subcutaneous tissue following other procedure: Secondary | ICD-10-CM | POA: Diagnosis not present

## 2022-01-08 DIAGNOSIS — Z01818 Encounter for other preprocedural examination: Secondary | ICD-10-CM | POA: Diagnosis not present

## 2022-01-08 DIAGNOSIS — R9431 Abnormal electrocardiogram [ECG] [EKG]: Secondary | ICD-10-CM | POA: Diagnosis not present

## 2022-01-08 DIAGNOSIS — T8451XA Infection and inflammatory reaction due to internal right hip prosthesis, initial encounter: Secondary | ICD-10-CM | POA: Diagnosis not present

## 2022-01-08 DIAGNOSIS — I482 Chronic atrial fibrillation, unspecified: Secondary | ICD-10-CM | POA: Diagnosis not present

## 2022-01-08 DIAGNOSIS — R404 Transient alteration of awareness: Secondary | ICD-10-CM | POA: Diagnosis not present

## 2022-01-08 DIAGNOSIS — M47812 Spondylosis without myelopathy or radiculopathy, cervical region: Secondary | ICD-10-CM | POA: Diagnosis not present

## 2022-01-08 DIAGNOSIS — M25572 Pain in left ankle and joints of left foot: Secondary | ICD-10-CM | POA: Diagnosis not present

## 2022-01-08 DIAGNOSIS — S0990XA Unspecified injury of head, initial encounter: Secondary | ICD-10-CM | POA: Diagnosis not present

## 2022-01-08 DIAGNOSIS — Z96641 Presence of right artificial hip joint: Secondary | ICD-10-CM | POA: Diagnosis not present

## 2022-01-08 MED ORDER — POLYETHYLENE GLYCOL 3350 17 G PO PACK: 17.0000 g | PACK | Freq: Every day | ORAL | 0 refills | Status: AC

## 2022-01-08 MED ORDER — QUETIAPINE FUMARATE 25 MG PO TABS: 25.0000 mg | ORAL_TABLET | Freq: Every day | ORAL | Status: AC

## 2022-01-08 MED ORDER — SENNA 8.6 MG PO TABS: 1.0000 | ORAL_TABLET | Freq: Two times a day (BID) | ORAL | 0 refills | Status: AC

## 2022-01-08 NOTE — Discharge Summary (Signed)
Physician Discharge Summary  Noah Waters ZOX:[id number] DOB: 1925-03-17 DOA: 01/01/2022  PCP: Wardell Honour, MD  Admit date: 01/01/2022 Discharge date: 01/08/2022  Admitted From: Home Disposition: SNF  Discharge Condition:Stable CODE STATUS:FULL Diet recommendation: Heart Healthy  Brief/Interim Summary: Patient is a 88 year old male with history of chronic A-fib not on anticoagulation, hypertension who presented from home after she fell resulting in right hip pain.  Imaging showed right femoral neck fracture, orthopedics consulted, status post hemiarthroplasty on 10/3.  PT/OT recommending SNF.  Hospital course remarkable for agitation, delirium now mentation is much better.  Medically stable for discharge to SNF.  Needs to follow-up with orthopedics in 2 weeks for x-ray and wound check.  Following problems were addressed during his hospitalization:  Right femoral neck fracture: Fell at home.  Presented with severe right hip pain.  Imaging showed right femoral neck fracture.  Status post right hemiarthroplasty on 10/3.   Continue pain management supportive care, bowel regimen.  On aspirin for DVT prophylaxis. PT/OT recommending SNF.     Delirium/dementia?:  Patient is confused. He could have developed delirium.  Has memory issues,confused at baseline as per daughter.  Continue delirium precautions, frequent reorientation.  Minimize narcotics, sedatives. Started on Seroquel at bedtime which has improved his orientation .  He is not agitated today, obeys commands. Can discontinue Seroquel in the future if not needed.  Chronic A-fib: Not on anticoagulation.  Currently rate is controlled.Not on rate control medications   Hypertension: On amlodipine 2.5 mg daily   Thrombocytopenia: Stable   Hypokalemia: Supplemented and corrected   Normocytic anemia: Hemoglobin stable in the range of 11       Discharge Diagnoses:  Principal Problem:   Fracture of femoral neck, right, closed  ([place]) Active Problems:   Delirium   Essential hypertension, benign   Chronic atrial fibrillation [facility])    Discharge Instructions  Discharge Instructions     Diet - low sodium heart healthy   Complete by: As directed    Discharge instructions   Complete by: As directed    1)Please take prescribed medications as instructed 2)Follow up with orthopedics in 2 weeks.  Name and number the provider has been attached   Increase activity slowly   Complete by: As directed    No wound care   Complete by: As directed       Allergies as of 01/08/2022   No Known Allergies      Medication List     STOP taking these medications    miconazole 2 % powder Commonly known as: MICOTIN       TAKE these medications    amLODipine 2.5 MG tablet Commonly known as: NORVASC TAKE 1 TABLET BY MOUTH EVERY DAY   aspirin 81 MG chewable tablet Commonly known as: Aspirin Childrens Chew 1 tablet (81 mg total) by mouth 2 (two) times daily with a meal.   HYDROcodone-acetaminophen 10-325 MG tablet Commonly known as: Norco Take 0.5 tablets by mouth every 4 (four) hours as needed for up to 7 days for moderate pain or severe pain.   polyethylene glycol 17 g packet Commonly known as: MIRALAX / GLYCOLAX Take 17 g by mouth daily. Start taking on: January 09, 2022   QUEtiapine 25 MG tablet Commonly known as: SEROQUEL Take 1 tablet (25 mg total) by mouth at bedtime.   senna 8.6 MG Tabs tablet Commonly known as: SENOKOT Take 1 tablet (8.6 mg total) by mouth 2 (two) times daily.  Contact information for follow-up providers     Rod Can, MD Follow up in 2 week(s).   Specialty: Orthopedic Surgery Why: For suture removal, For wound re-check Contact information: [address] [place] [address] [phone]              Contact information for after-discharge care     Destination     [facility] Preferred SNF .   Service: Skilled  Nursing Contact information: [id number] [phone]                    No Known Allergies  Consultations: Orthopedics   Procedures/Studies: MR BRAIN WO CONTRAST  Result Date: 01/05/2022 CLINICAL DATA:  Initial evaluation for neuro deficit, stroke suspected. EXAM: MRI HEAD WITHOUT CONTRAST TECHNIQUE: Multiplanar, multiecho pulse sequences of the brain and surrounding structures were obtained without intravenous contrast. COMPARISON:  Prior CT from 01/01/2022. FINDINGS: Brain: Diffuse prominence of the CSF containing spaces compatible generalized cerebral atrophy. Patchy and confluent T2/FLAIR hyperintensity involving the periventricular and deep white matter both cerebral hemispheres as well as the pons, most consistent with chronic small vessel ischemic disease, moderately advanced for age. Small remote left cerebellar infarct noted. No evidence for acute or subacute ischemia. Gray-white matter differentiation maintained. No other areas of chronic cortical infarction. No acute or chronic intracranial blood products. No mass lesion, midline shift or mass effect. Diffuse ventricular prominence related to global parenchymal volume loss of hydrocephalus. No extra-axial fluid collection. Empty sella noted. Vascular: Heterogeneous flow void within the intradural left V4 segment, which could be related to slow flow and/or occlusion. Major intracranial vascular flow voids are otherwise maintained. Skull and upper cervical spine: Craniocervical junction within normal limits. Moderate spondylosis noted within the visualized upper cervical spine. No focal marrow replacing lesion. Small lipoma noted at the right frontal scalp. Sinuses/Orbits: Prior bilateral ocular lens replacement. Paranasal sinuses are largely clear. No significant mastoid effusion. Other: None. IMPRESSION: 1. No acute intracranial abnormality. 2. Moderately advanced cerebral atrophy with  chronic small vessel ischemic disease. Small remote left cerebellar infarct. 3. Heterogeneous flow void within the intradural left V4 segment, which could be related to slow flow and/or occlusion. Electronically Signed   By: Jeannine Boga M.D.   On: 01/05/2022 00:40   DG Pelvis Portable  Result Date: 01/03/2022 CLINICAL DATA:  RIGHT hip replacement surgery yesterday. EXAM: PORTABLE PELVIS 1-2 VIEWS COMPARISON:  Plain film of the pelvis dated 01/02/2022 FINDINGS: Interval RIGHT hip arthroplasty. Hardware appears intact and appropriately positioned. Expected postsurgical changes within the overlying soft tissues. IMPRESSION: Interval RIGHT hip arthroplasty. No evidence of surgical complicating feature. Electronically Signed   By: Franki Cabot M.D.   On: 01/03/2022 07:41   DG HIP UNILAT WITH PELVIS 1V RIGHT  Result Date: 01/02/2022 CLINICAL DATA:  Right femoral neck fracture EXAM: DG HIP (WITH OR WITHOUT PELVIS) 1V RIGHT COMPARISON:  Film from earlier in the same day. FLUOROSCOPY TIME:  Radiation Exposure Index (as provided by the fluoroscopic device): 0.61 mGy If the device does not provide the exposure index: Fluoroscopy Time:  8 seconds Number of Acquired Images:  4 FINDINGS: Initial images demonstrate a spacer in the proximal femur. The femoral neck and femoral head of been removed. Subsequent placement of the prosthesis is noted in satisfactory position. No acute bony or soft tissue abnormality is noted. IMPRESSION: Right hip replacement Electronically Signed   By: Inez Catalina M.D.   On: 01/02/2022  19:22   DG C-Arm 1-60 Min-No Report  Result Date: 01/02/2022 Fluoroscopy was utilized by the requesting physician.  No radiographic interpretation.   DG Knee Right Port  Result Date: 01/02/2022 CLINICAL DATA:  Hip fracture EXAM: PORTABLE RIGHT KNEE - 1-2 VIEW COMPARISON:  None Available. FINDINGS: No evidence of fracture, dislocation, or joint effusion. Medial compartment joint space narrowing.  Chondrocalcinosis. Soft tissues are unremarkable. IMPRESSION: 1. No acute osseous abnormality of the right knee. 2. Medial compartment joint space narrowing. 3. Chondrocalcinosis. Electronically Signed   By: Davina Poke D.O.   On: 01/02/2022 08:31   DG Pelvis Portable  Result Date: 01/02/2022 CLINICAL DATA:  Hip fracture EXAM: PORTABLE PELVIS 1-2 VIEWS COMPARISON:  CT 01/01/2022 FINDINGS: Patient is rotated. Acute right femoral neck fracture with shortening and external rotation. No dislocation. No additional fractures. Bones appear demineralized. No appreciable soft tissue abnormality. IMPRESSION: Acute right femoral neck fracture. Electronically Signed   By: Davina Poke D.O.   On: 01/02/2022 08:30   Chest Portable 1 View  Result Date: 01/01/2022 CLINICAL DATA:  Preop, right hip fracture EXAM: PORTABLE CHEST 1 VIEW COMPARISON:  None Available. FINDINGS: Lungs are clear.  No pleural effusion or pneumothorax. The heart is normal in size. IMPRESSION: No evidence of acute cardiopulmonary disease. Electronically Signed   By: Julian Hy M.D.   On: 01/01/2022 22:18   CT Hip Right Wo Contrast  Result Date: 01/01/2022 CLINICAL DATA:  Right hip pain.  Fall EXAM: CT OF THE RIGHT HIP WITHOUT CONTRAST TECHNIQUE: Multidetector CT imaging of the right hip was performed according to the standard protocol. Multiplanar CT image reconstructions were also generated. RADIATION DOSE REDUCTION: This exam was performed according to the departmental dose-optimization program which includes automated exposure control, adjustment of the mA and/or kV according to patient size and/or use of iterative reconstruction technique. COMPARISON:  None Available. FINDINGS: Bones/Joint/Cartilage Acute right femoral neck fracture, partially subcapital in location. There is shortening and external rotation of the femoral shaft. Impaction with varus and anterior apex angulation at the fracture site. No involvement of the  intertrochanteric region. Femoroacetabular joint alignment is maintained without dislocation. The right hemipelvis is intact without fracture or diastasis. No lytic or sclerotic bone lesions are seen. Degenerative disc disease of the imaged lower lumbar spine. Ligaments Suboptimally assessed by CT. Muscles and Tendons No acute musculotendinous abnormality by CT. Soft tissues No hematoma within the soft tissues.  Aortoiliac atherosclerosis. IMPRESSION: Acute right femoral neck fracture, as described above. Aortic Atherosclerosis (ICD10-I70.0). Electronically Signed   By: Davina Poke D.O.   On: 01/01/2022 19:12   CT Head Wo Contrast  Result Date: 01/01/2022 CLINICAL DATA:  Status post fall. EXAM: CT HEAD WITHOUT CONTRAST TECHNIQUE: Contiguous axial images were obtained from the base of the skull through the vertex without intravenous contrast. RADIATION DOSE REDUCTION: This exam was performed according to the departmental dose-optimization program which includes automated exposure control, adjustment of the mA and/or kV according to patient size and/or use of iterative reconstruction technique. COMPARISON:  None Available. FINDINGS: Brain: There is moderate severity cerebral atrophy with widening of the extra-axial spaces and ventricular dilatation. There are areas of decreased attenuation within the white matter tracts of the supratentorial brain, consistent with microvascular disease changes. Vascular: No hyperdense vessel or unexpected calcification. Skull: Normal. Negative for fracture or focal lesion. Sinuses/Orbits: No acute finding. 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 IMPRESSION: 1. No acute intracranial abnormality. 2. Generalized  cerebral atrophy with widening of the extra-axial spaces and ventricular dilatation. Electronically Signed   By: Virgina Norfolk M.D.   On: 01/01/2022 19:11   CT Cervical Spine Wo Contrast  Result Date: 01/01/2022 CLINICAL DATA:   Ground level fall at home, right hip pain EXAM: CT CERVICAL SPINE WITHOUT CONTRAST TECHNIQUE: Multidetector CT imaging of the cervical spine was performed without intravenous contrast. Multiplanar CT image reconstructions were also generated. RADIATION DOSE REDUCTION: This exam was performed according to the departmental dose-optimization program which includes automated exposure control, adjustment of the mA and/or kV according to patient size and/or use of iterative reconstruction technique. COMPARISON:  None Available. FINDINGS: Alignment: Alignment is anatomic. Skull base and vertebrae: No acute fracture. No primary bone lesion or focal pathologic process. Soft tissues and spinal canal: No prevertebral fluid or swelling. No visible canal hematoma. Disc levels: There is diffuse multilevel spondylosis and facet hypertrophy. Circumferential disc osteophyte formation is most pronounced from C3-4 through C6-7, with central canal and neural foraminal encroachment greatest at C3-4, C4-5, and C6-7. Upper chest: Deviation of the trachea to the right by an enlarged heterogeneous thyroid, most consistent with goiter. A non-emergent outpatient thyroid ultrasound could be considered if clinically warranted given patient age. Reference: J Am Coll Radiol. 2015 Feb;12(2): 143-50 The airway is patent.  Lung apices are clear. Other: Reconstructed images demonstrate no additional findings. IMPRESSION: 1. No evidence of cervical spine fracture. 2. Extensive multilevel spondylosis and facet hypertrophy. Electronically Signed   By: Randa Ngo M.D.   On: 01/01/2022 19:03      Subjective: Patient seen and examined at bedside today.  Hemodynamically stable for discharge.  I called the daughter and discussed about the discharge planning  Discharge Exam: Vitals:   01/08/22 0325 01/08/22 0812  BP: (!) 146/76 (!) 148/82  Pulse: 77 68  Resp: 18 17  Temp: 99.2 F (37.3 C) 98.2 F (36.8 C)  SpO2: 96% 100%   Vitals:    01/07/22 0539 01/07/22 2104 01/08/22 0325 01/08/22 0812  BP: (!) 154/91 (!) 161/98 (!) 146/76 (!) 148/82  Pulse: 69 75 77 68  Resp: 16 19 18 17   Temp: 98.6 F (37 C) 98.6 F (37 C) 99.2 F (37.3 C) 98.2 F (36.8 C)  TempSrc:   Axillary Oral  SpO2:   96% 100%  Weight:      Height:        General: Pt is alert, awake, not in acute distress Cardiovascular: RRR, S1/S2 +, no rubs, no gallops Respiratory: CTA bilaterally, no wheezing, no rhonchi Abdominal: Soft, NT, ND, bowel sounds + Extremities: no edema, no cyanosis, right hip surgical wound    The results of significant diagnostics from this hospitalization (including imaging, microbiology, ancillary and laboratory) are listed below for reference.     Microbiology: Recent Results (from the past 240 hour(s))  Surgical pcr screen     Status: None   Collection Time: 01/02/22  3:40 PM   Specimen: Nasal Mucosa; Nasal Swab  Result Value Ref Range Status   MRSA, PCR NEGATIVE NEGATIVE Final   Staphylococcus aureus NEGATIVE NEGATIVE Final    Comment: (NOTE) The Xpert SA Assay (FDA approved for NASAL specimens in patients 78 years of age and older), is one component of a comprehensive surveillance program. It is not intended to diagnose infection nor to guide or monitor treatment. Performed at [facility], [place] [address] [place], [place] [address]      Labs: BNP (last 3 results) No results  for input(s): "BNP" in the last 8760 hours. Basic Metabolic Panel: Recent Labs  Lab 01/01/22 1955 01/02/22 0348 01/03/22 0339 01/07/22 0326  NA 137 139 135 137  K 3.2* 3.5 3.7 3.8  CL 102 102 102 104  CO2 26 27 25 26   GLUCOSE 134* 129* 112* 90  BUN 12 12 15 20   CREATININE 0.95 0.94 1.10 0.83  CALCIUM 8.5* 8.7* 8.4* 8.3*  MG  --  1.9  --   --    Liver Function Tests: No results for input(s): "AST", "ALT", "ALKPHOS", "BILITOT", "PROT", "ALBUMIN" in the last 168 hours. No results for input(s): "LIPASE", "AMYLASE" in the  last 168 hours. No results for input(s): "AMMONIA" in the last 168 hours. CBC: Recent Labs  Lab 01/01/22 1955 01/02/22 0348 01/03/22 0339 01/04/22 0306 01/07/22 0326  WBC 6.4 7.1 8.1 7.8 6.4  HGB 13.6 13.0 11.9* 11.3* 11.6*  HCT 40.3 40.2 36.9* 33.4* 34.4*  MCV 91.4 93.3 92.0 89.8 89.8  PLT 126* 129* 107* 97* 143*   Cardiac Enzymes: No results for input(s): "CKTOTAL", "CKMB", "CKMBINDEX", "TROPONINI" in the last 168 hours. BNP: Invalid input(s): "POCBNP" CBG: Recent Labs  Lab 01/02/22 1151 01/03/22 0902 01/04/22 0853 01/04/22 1145 01/04/22 1615  GLUCAP 118* 101* 86 110* 113*   D-Dimer No results for input(s): "DDIMER" in the last 72 hours. Hgb A1c No results for input(s): "HGBA1C" in the last 72 hours. Lipid Profile No results for input(s): "CHOL", "HDL", "LDLCALC", "TRIG", "CHOLHDL", "LDLDIRECT" in the last 72 hours. Thyroid function studies No results for input(s): "TSH", "T4TOTAL", "T3FREE", "THYROIDAB" in the last 72 hours.  Invalid input(s): "FREET3" Anemia work up No results for input(s): "VITAMINB12", "FOLATE", "FERRITIN", "TIBC", "IRON", "RETICCTPCT" in the last 72 hours. Urinalysis    Component Value Date/Time   BILIRUBINUR neg 11/28/2012 1210   PROTEINUR neg 11/28/2012 1210   UROBILINOGEN 0.2 11/28/2012 1210   NITRITE neg 11/28/2012 1210   LEUKOCYTESUR Negative 11/28/2012 1210   Sepsis Labs Recent Labs  Lab 01/02/22 0348 01/03/22 0339 01/04/22 0306 01/07/22 0326  WBC 7.1 8.1 7.8 6.4   Microbiology Recent Results (from the past 240 hour(s))  Surgical pcr screen     Status: None   Collection Time: 01/02/22  3:40 PM   Specimen: Nasal Mucosa; Nasal Swab  Result Value Ref Range Status   MRSA, PCR NEGATIVE NEGATIVE Final   Staphylococcus aureus NEGATIVE NEGATIVE Final    Comment: (NOTE) The Xpert SA Assay (FDA approved for NASAL specimens in patients 43 years of age and older), is one component of a comprehensive surveillance program. It is  not intended to diagnose infection nor to guide or monitor treatment. Performed at [facility], [place] [address], [place], [place] [address]     Please note: You were cared for by a hospitalist during your hospital stay. Once you are discharged, your primary care physician will handle any further medical issues. Please note that NO REFILLS for any discharge medications will be authorized once you are discharged, as it is imperative that you return to your primary care physician (or establish a relationship with a primary care physician if you do not have one) for your post hospital discharge needs so that they can reassess your need for medications and monitor your lab values.    Time coordinating discharge: 40 minutes  SIGNED:   Shelly Coss, MD  [facility] 01/08/2022, 11:03 AM Pager [phone]  If 7PM-7AM, please contact night-coverage [email or web address] Password [email or web address]

## 2022-01-08 NOTE — TOC Transition Note (Signed)
Transition of Care [facility]) - CM/SW Discharge Note   Patient Details  Name: Noah Waters MRN: [id number] Date of Birth: 07-Nov-1924  Transition of Care [facility] CM/SW Contact:  Noah Chars, Noah Waters Phone Number: 01/08/2022, 1:01 PM   Clinical Narrative:   Pt discharging to [facility].  RN call report to [phone].     Final next level of care: Skilled Nursing Facility Barriers to Discharge: Barriers Resolved   Patient Goals and CMS Choice     Choice offered to / list presented to : Adult Children  Discharge Placement              Patient chooses bed at:  [facility]) Patient to be transferred to facility by: [place] Name of family member notified: daughter Noah Waters Patient and family notified of of transfer: 01/08/22  Discharge Plan and Services                                     Social Determinants of Health (SDOH) Interventions     Readmission Risk Interventions     No data to display

## 2022-01-08 NOTE — Plan of Care (Signed)

## 2022-01-08 NOTE — Progress Notes (Signed)
Physical Therapy Treatment Patient Details Name: Noah Waters MRN: [id number] DOB: 1925/03/24 Today's Date: 01/08/2022   History of Present Illness Pt is a 88 year old man admitted 01/02/22 after a fall in which he sustained a R femoral neck fx. Underwent R hip hemiarthroplasty, anterior approach. PMH: dementia, afib.    PT Comments    Pt not following commands this session, likely impacted by Haldol given this AM. Performed AAROM exercises of right lower extremity at bed level. Pt requiring max-total assist for bed mobility and unable to initiate standing due to lack of initiation, despite hand over hand guidance. Continue to recommend SNF for ongoing Physical Therapy.     Recommendations for follow up therapy are one component of a multi-disciplinary discharge planning process, led by the attending physician.  Recommendations may be updated based on patient status, additional functional criteria and insurance authorization.  Follow Up Recommendations  Skilled nursing-short term rehab (<3 hours/day) Can patient physically be transported by private vehicle: No   Assistance Recommended at Discharge Frequent or constant Supervision/Assistance  Patient can return home with the following Two people to help with walking and/or transfers;A lot of help with bathing/dressing/bathroom;Assistance with cooking/housework;Assist for transportation;Help with stairs or ramp for entrance   Equipment Recommendations  Other (comment) (defer)    Recommendations for Other Services       Precautions / Restrictions Precautions Precautions: Fall Restrictions Weight Bearing Restrictions: Yes RLE Weight Bearing: Weight bearing as tolerated     Mobility  Bed Mobility Overal bed mobility: Needs Assistance Bed Mobility: Rolling, Supine to Sit, Sit to Supine Rolling: Total assist   Supine to sit: Max assist Sit to supine: Total assist   General bed mobility comments: Pt requiring max-totalA for bed  mobility, limited due to decreased cognition.    Transfers                   General transfer comment: unable    Ambulation/Gait                   Stairs             Wheelchair Mobility    Modified Rankin (Stroke Patients Only)       Balance Overall balance assessment: Needs assistance Sitting-balance support: Feet supported Sitting balance-Leahy Scale: Fair Sitting balance - Comments: Progressing to fair, intermittent right lateral lean, supervision-min guard for safety                                    Cognition Arousal/Alertness: Awake/alert Behavior During Therapy: Flat affect, Restless Overall Cognitive Status: No family/caregiver present to determine baseline cognitive functioning                                 General Comments: Hx of dementia, unable to follow commands this session, very restless        Exercises General Exercises - Lower Extremity Heel Slides: AAROM, Right, 10 reps, Supine Hip ABduction/ADduction: AAROM, Right, 10 reps, Supine    General Comments        Pertinent Vitals/Pain Pain Assessment Pain Assessment: Faces Breathing: normal Negative Vocalization: occasional moan/groan, low speech, negative/disapproving quality Facial Expression: facial grimacing Body Language: rigid, fists clenched, knees up, pushing/pulling away, strikes out Consolability: unable to console, distract or reassure PAINAD Score: 7 Pain Location: R hip Pain Descriptors /  Indicators: Sore, Discomfort Pain Intervention(s): Monitored during session, Limited activity within patient's tolerance, Repositioned    Home Living                          Prior Function            PT Goals (current goals can now be found in the care plan section) Acute Rehab PT Goals Potential to Achieve Goals: Fair    Frequency    Min 3X/week      PT Plan Current plan remains appropriate    Co-evaluation               AM-PAC PT "6 Clicks" Mobility   Outcome Measure  Help needed turning from your back to your side while in a flat bed without using bedrails?: Total Help needed moving from lying on your back to sitting on the side of a flat bed without using bedrails?: A Lot Help needed moving to and from a bed to a chair (including a wheelchair)?: Total Help needed standing up from a chair using your arms (e.g., wheelchair or bedside chair)?: Total Help needed to walk in hospital room?: Total Help needed climbing 3-5 steps with a railing? : Total 6 Click Score: 7    End of Session   Activity Tolerance: Other (comment) (limited due to behavior) Patient left: in bed;with call bell/phone within reach;with bed alarm set Nurse Communication: Mobility status PT Visit Diagnosis: Unsteadiness on feet (R26.81);Muscle weakness (generalized) (M62.81);Pain Pain - Right/Left: Right Pain - part of body: Leg     Time: [id number] PT Time Calculation (min) (ACUTE ONLY): 30 min  Charges:  $Therapeutic Activity: 23-37 mins                     Wyona Almas, PT, DPT Acute Rehabilitation Services Office [phone]    Deno Etienne 01/08/2022, 1:51 PM

## 2022-01-09 DIAGNOSIS — I482 Chronic atrial fibrillation, unspecified: Secondary | ICD-10-CM | POA: Diagnosis not present

## 2022-01-09 DIAGNOSIS — Z9181 History of falling: Secondary | ICD-10-CM | POA: Diagnosis not present

## 2022-01-09 DIAGNOSIS — I10 Essential (primary) hypertension: Secondary | ICD-10-CM | POA: Diagnosis not present

## 2022-01-09 DIAGNOSIS — S72001S Fracture of unspecified part of neck of right femur, sequela: Secondary | ICD-10-CM | POA: Diagnosis not present

## 2022-01-10 DIAGNOSIS — I482 Chronic atrial fibrillation, unspecified: Secondary | ICD-10-CM | POA: Diagnosis not present

## 2022-01-10 DIAGNOSIS — Z471 Aftercare following joint replacement surgery: Secondary | ICD-10-CM | POA: Diagnosis not present

## 2022-01-10 DIAGNOSIS — S72001S Fracture of unspecified part of neck of right femur, sequela: Secondary | ICD-10-CM | POA: Diagnosis not present

## 2022-01-10 DIAGNOSIS — D649 Anemia, unspecified: Secondary | ICD-10-CM | POA: Diagnosis not present

## 2022-01-10 DIAGNOSIS — I10 Essential (primary) hypertension: Secondary | ICD-10-CM | POA: Diagnosis not present

## 2022-01-10 DIAGNOSIS — Z9181 History of falling: Secondary | ICD-10-CM | POA: Diagnosis not present

## 2022-01-11 ENCOUNTER — Other Ambulatory Visit: Payer: Self-pay | Admitting: *Deleted

## 2022-01-11 NOTE — Patient Outreach (Signed)
Mr. Lindaman resides in [facility]. Screening for  potential [facility] care coordination services as benefit of insurance plan and PCP.  Met with [facility] SW Hoyle Sauer who reports Mr. Martorana will transition to home with one of his daughters post SNF. Will require DME hospital bed, bedside commode, rolling walker, and transport chair. May require hoyer lift pending progress.   Daughters are primary contact.  Will continue to follow.   Marthenia Rolling, MSN, RN,BSN [facility] Acute Care Coordinator [phone] (Direct dial)

## 2022-01-12 DIAGNOSIS — I10 Essential (primary) hypertension: Secondary | ICD-10-CM | POA: Diagnosis not present

## 2022-01-12 DIAGNOSIS — Z471 Aftercare following joint replacement surgery: Secondary | ICD-10-CM | POA: Diagnosis not present

## 2022-01-12 DIAGNOSIS — Z9181 History of falling: Secondary | ICD-10-CM | POA: Diagnosis not present

## 2022-01-12 DIAGNOSIS — S72001S Fracture of unspecified part of neck of right femur, sequela: Secondary | ICD-10-CM | POA: Diagnosis not present

## 2022-01-15 DIAGNOSIS — Z471 Aftercare following joint replacement surgery: Secondary | ICD-10-CM | POA: Diagnosis not present

## 2022-01-15 DIAGNOSIS — I10 Essential (primary) hypertension: Secondary | ICD-10-CM | POA: Diagnosis not present

## 2022-01-15 DIAGNOSIS — I482 Chronic atrial fibrillation, unspecified: Secondary | ICD-10-CM | POA: Diagnosis not present

## 2022-01-15 DIAGNOSIS — S72001S Fracture of unspecified part of neck of right femur, sequela: Secondary | ICD-10-CM | POA: Diagnosis not present

## 2022-01-16 ENCOUNTER — Emergency Department (HOSPITAL_COMMUNITY)
Admission: EM | Admit: 2022-01-16 | Discharge: 2022-01-16 | Disposition: A | Discharge status: Home / Self Care | Payer: Medicare Other | Attending: Emergency Medicine | Admitting: Emergency Medicine

## 2022-01-16 ENCOUNTER — Encounter (HOSPITAL_COMMUNITY): Payer: Self-pay | Admitting: Emergency Medicine

## 2022-01-16 ENCOUNTER — Emergency Department (HOSPITAL_COMMUNITY): Payer: Medicare Other

## 2022-01-16 ENCOUNTER — Other Ambulatory Visit: Payer: Self-pay

## 2022-01-16 DIAGNOSIS — Z96641 Presence of right artificial hip joint: Secondary | ICD-10-CM | POA: Insufficient documentation

## 2022-01-16 DIAGNOSIS — R410 Disorientation, unspecified: Secondary | ICD-10-CM | POA: Diagnosis not present

## 2022-01-16 DIAGNOSIS — F0390 Unspecified dementia without behavioral disturbance: Secondary | ICD-10-CM | POA: Insufficient documentation

## 2022-01-16 DIAGNOSIS — I6782 Cerebral ischemia: Secondary | ICD-10-CM | POA: Diagnosis not present

## 2022-01-16 DIAGNOSIS — W19XXXA Unspecified fall, initial encounter: Secondary | ICD-10-CM

## 2022-01-16 DIAGNOSIS — S0990XA Unspecified injury of head, initial encounter: Secondary | ICD-10-CM | POA: Diagnosis not present

## 2022-01-16 DIAGNOSIS — Z01818 Encounter for other preprocedural examination: Secondary | ICD-10-CM | POA: Diagnosis not present

## 2022-01-16 DIAGNOSIS — J9859 Other diseases of mediastinum, not elsewhere classified: Secondary | ICD-10-CM | POA: Diagnosis not present

## 2022-01-16 DIAGNOSIS — M533 Sacrococcygeal disorders, not elsewhere classified: Secondary | ICD-10-CM | POA: Diagnosis not present

## 2022-01-16 DIAGNOSIS — I6523 Occlusion and stenosis of bilateral carotid arteries: Secondary | ICD-10-CM | POA: Diagnosis not present

## 2022-01-16 DIAGNOSIS — Z79899 Other long term (current) drug therapy: Secondary | ICD-10-CM | POA: Diagnosis not present

## 2022-01-16 DIAGNOSIS — S199XXA Unspecified injury of neck, initial encounter: Secondary | ICD-10-CM | POA: Diagnosis not present

## 2022-01-16 DIAGNOSIS — L7634 Postprocedural seroma of skin and subcutaneous tissue following other procedure: Secondary | ICD-10-CM | POA: Insufficient documentation

## 2022-01-16 DIAGNOSIS — M47812 Spondylosis without myelopathy or radiculopathy, cervical region: Secondary | ICD-10-CM | POA: Diagnosis not present

## 2022-01-16 DIAGNOSIS — I10 Essential (primary) hypertension: Secondary | ICD-10-CM | POA: Diagnosis not present

## 2022-01-16 DIAGNOSIS — T8451XA Infection and inflammatory reaction due to internal right hip prosthesis, initial encounter: Secondary | ICD-10-CM | POA: Diagnosis not present

## 2022-01-16 DIAGNOSIS — M25551 Pain in right hip: Secondary | ICD-10-CM | POA: Diagnosis not present

## 2022-01-16 DIAGNOSIS — Z7982 Long term (current) use of aspirin: Secondary | ICD-10-CM | POA: Insufficient documentation

## 2022-01-16 DIAGNOSIS — G9389 Other specified disorders of brain: Secondary | ICD-10-CM | POA: Diagnosis not present

## 2022-01-16 DIAGNOSIS — R9431 Abnormal electrocardiogram [ECG] [EKG]: Secondary | ICD-10-CM | POA: Diagnosis not present

## 2022-01-16 DIAGNOSIS — I482 Chronic atrial fibrillation, unspecified: Secondary | ICD-10-CM | POA: Diagnosis not present

## 2022-01-16 LAB — CBC WITH DIFFERENTIAL/PLATELET
Abs Immature Granulocytes: 0.02 10*3/uL (ref 0.00–0.07)
Basophils Absolute: 0 10*3/uL (ref 0.0–0.1)
Basophils Relative: 1 %
Eosinophils Absolute: 0.1 10*3/uL (ref 0.0–0.5)
Eosinophils Relative: 2 %
HCT: 38.4 % — ABNORMAL LOW (ref 39.0–52.0)
Hemoglobin: 12.2 g/dL — ABNORMAL LOW (ref 13.0–17.0)
Immature Granulocytes: 0 %
Lymphocytes Relative: 12 %
Lymphs Abs: 0.6 10*3/uL — ABNORMAL LOW (ref 0.7–4.0)
MCH: 30 pg (ref 26.0–34.0)
MCHC: 31.8 g/dL (ref 30.0–36.0)
MCV: 94.3 fL (ref 80.0–100.0)
Monocytes Absolute: 0.4 10*3/uL (ref 0.1–1.0)
Monocytes Relative: 7 %
Neutro Abs: 3.7 10*3/uL (ref 1.7–7.7)
Neutrophils Relative %: 78 %
Platelets: 262 10*3/uL (ref 150–400)
RBC: 4.07 MIL/uL — ABNORMAL LOW (ref 4.22–5.81)
RDW: 13.9 % (ref 11.5–15.5)
WBC: 4.8 10*3/uL (ref 4.0–10.5)
nRBC: 0 % (ref 0.0–0.2)

## 2022-01-16 LAB — BASIC METABOLIC PANEL
Anion gap: 9 (ref 5–15)
BUN: 17 mg/dL (ref 8–23)
CO2: 25 mmol/L (ref 22–32)
Calcium: 9.1 mg/dL (ref 8.9–10.3)
Chloride: 106 mmol/L (ref 98–111)
Creatinine, Ser: 0.71 mg/dL (ref 0.61–1.24)
GFR, Estimated: >60 mL/min (ref >60)
Glucose, Bld: 90 mg/dL (ref 70–99)
Potassium: 3.7 mmol/L (ref 3.5–5.1)
Sodium: 140 mmol/L (ref 135–145)

## 2022-01-16 LAB — URINALYSIS, ROUTINE W REFLEX MICROSCOPIC
Bacteria, UA: NONE SEEN
Bilirubin Urine: NEGATIVE
Glucose, UA: NEGATIVE mg/dL
Ketones, ur: NEGATIVE mg/dL
Leukocytes,Ua: NEGATIVE
Nitrite: NEGATIVE
Protein, ur: NEGATIVE mg/dL
Specific Gravity, Urine: 1.036 — ABNORMAL HIGH (ref 1.005–1.030)
pH: 6 (ref 5.0–8.0)

## 2022-01-16 MED ORDER — IPRATROPIUM-ALBUTEROL 0.5-2.5 (3) MG/3ML IN SOLN: 3.0000 mL | Freq: Once | RESPIRATORY_TRACT | Status: DC

## 2022-01-16 MED ORDER — IOHEXOL 350 MG/ML SOLN
75.0000 mL | Freq: Once | INTRAVENOUS | Status: AC | PRN
  Administered 2022-01-16: 75 mL via INTRAVENOUS

## 2022-01-16 NOTE — ED Notes (Signed)
Pt returned from CT °

## 2022-01-16 NOTE — ED Notes (Signed)
Called [facility] to transport patient back to [facility], patient is 4th on the list.

## 2022-01-16 NOTE — ED Notes (Addendum)
Pt taken to CT.

## 2022-01-16 NOTE — ED Notes (Signed)
Pt waiting on [facility] 

## 2022-01-16 NOTE — Progress Notes (Signed)
Patient ID: Noah Waters, male   DOB: 1924-12-12, 88 y.o.   MRN: [id number]   LOS: 0 days   Subjective: Doing well, no c/o pain.    Objective: Vital signs in last 24 hours: Temp:  [97.5 F (36.4 C)-97.6 F (36.4 C)] 97.5 F (36.4 C) (10/17 1206) Pulse Rate:  [51-78] 64 (10/17 1430) Resp:  [15-27] 20 (10/17 1430) BP: (128-168)/(69-99) 167/99 (10/17 1430) SpO2:  [93 %-100 %] 99 % (10/17 1430) Weight:  [68.5 kg] 68.5 kg (10/17 1134)     Laboratory  CBC Recent Labs    01/16/22 1126  WBC 4.8  HGB 12.2*  HCT 38.4*  PLT 262   BMET Recent Labs    01/16/22 1126  NA 140  K 3.7  CL 106  CO2 25  GLUCOSE 90  BUN 17  CREATININE 0.71  CALCIUM 9.1     Physical Exam General appearance: alert and no distress RLE: Incision C/D/I, SQ fluid collection, appears thin. No erythema, no TTP. Able to actively range hip without pain. 1+ DP, sensation intact.   Assessment/Plan: S/p right hip hemi -- Likely normal post-op changes. Ok to remove staples. F/u with Dr. Lyla Glassing as directed.      Lisette Abu, PA-C Orthopedic Surgery [phone] 01/16/2022

## 2022-01-16 NOTE — ED Provider Notes (Signed)
Patient here with large fluid collection on R side  Hilbert Odor to eval. Physical Exam  BP (!) 167/99   Pulse 64   Temp (!) 97.5 F (36.4 C) (Axillary)   Resp 20   Ht 6\' 2"  (1.88 m)   Wt 68.5 kg   SpO2 99%   BMI 19.39 kg/m   Physical Exam  Procedures  Procedures  ED Course / MDM   Clinical Course as of 01/16/22 1516  Tue Jan 16, 2022  1035 Attempted to call [facility] to obtain collateral information.  No answer. will try again soon. [CA]    Clinical Course User Index [CA] Suzy Bouchard, PA-C   Medical Decision Making Amount and/or Complexity of Data Reviewed Labs: ordered. Radiology: ordered.  Risk Prescription drug management.   Patient evaluated by

## 2022-01-16 NOTE — ED Triage Notes (Signed)
EMS stated he fell last night around 700pm  on rt. Hip  and fell on rt. Hip. The right hip is swollen.

## 2022-01-16 NOTE — ED Notes (Signed)
Katie RN and this nurse  removed 17 staples from R Hip. Incision is clean/dry/intact

## 2022-01-16 NOTE — Discharge Instructions (Addendum)
It was a pleasure taking care of you today. You did not break your hip. Your CT scan showed a large goiter, please follow-up with PCP for further evaluation.  Your hip showed a seroma, this is a collection of serous fluid under the skin and is a common post-surgical event. Contact a health care provider if: You have a fever. You have redness or pain at the site of your seroma. Your seroma is more swollen or is getting bigger. You have more fluid coming from your seroma. Your seroma feels warm to the touch. You have pus or a bad smell coming from your seroma. Get help right away if: You have a fever along with severe pain, redness at the site, or chills. You feel confused or have trouble staying awake. You feel like your heart is beating very fast. You feel short of breath or are breathing fast. You have cool, clammy, or sweaty skin.

## 2022-01-16 NOTE — ED Provider Notes (Signed)
[facility] EMERGENCY DEPARTMENT Provider Note   CSN: [id number] Arrival date & time: 01/16/22  7564     History  Chief Complaint  Patient presents with   Fall   Hip Pain    Noah Waters is a 88 y.o. male with a past medical history significant for hypertension and chronic A-fib not on any anticoagulants who presents to the ED after a fall.  Spoke to patient's daughter, Noah Waters, who notes patient has fallen 2-3 times since being discharged to [facility].  She notes that the living facility states that patient keeps attempting to get up and falls. He had a right hip replacement on 10/3 by Dr. Lyla Glassing after a fall.  Daughter notes the patient has not been the same ever since the fall and has been become more confused.  During initial evaluation, patient denies any pain.  Patient is unsure why he is here.  Denies right hip pain.  No headache.  History obtained from patient, daughter, and past medical records. No interpreter used during encounter.       Home Medications Prior to Admission medications   Medication Sig Start Date End Date Taking? Authorizing Provider  amLODipine (NORVASC) 2.5 MG tablet TAKE 1 TABLET BY MOUTH EVERY DAY Patient not taking: Reported on 09/28/2020 09/18/20   Horald Pollen, MD  aspirin (ASPIRIN CHILDRENS) 81 MG chewable tablet Chew 1 tablet (81 mg total) by mouth 2 (two) times daily with a meal. 01/03/22 02/17/22  Hill, Marciano Sequin, PA-C  polyethylene glycol (MIRALAX / GLYCOLAX) 17 g packet Take 17 g by mouth daily. 01/09/22   Shelly Coss, MD  QUEtiapine (SEROQUEL) 25 MG tablet Take 1 tablet (25 mg total) by mouth at bedtime. 01/08/22   Shelly Coss, MD  senna (SENOKOT) 8.6 MG TABS tablet Take 1 tablet (8.6 mg total) by mouth 2 (two) times daily. 01/08/22   Shelly Coss, MD      Allergies    Patient has no known allergies.    Review of Systems   Review of Systems  Unable to perform ROS: Dementia   Constitutional:  Negative for chills and fever.  Musculoskeletal:  Positive for joint swelling. Negative for arthralgias.  Skin:  Positive for wound. Negative for color change.  All other systems reviewed and are negative.   Physical Exam Updated Vital Signs BP (!) 167/99   Pulse 64   Temp (!) 97.5 F (36.4 C) (Axillary)   Resp 20   Ht 6\' 2"  (1.88 m)   Wt 68.5 kg   SpO2 99%   BMI 19.39 kg/m  Physical Exam Vitals and nursing note reviewed.  Constitutional:      General: He is not in acute distress.    Appearance: He is not ill-appearing.  HENT:     Head: Normocephalic.  Eyes:     Pupils: Pupils are equal, round, and reactive to light.  Cardiovascular:     Rate and Rhythm: Normal rate and regular rhythm.     Pulses: Normal pulses.     Heart sounds: Normal heart sounds. No murmur heard.    No friction rub. No gallop.  Pulmonary:     Effort: Pulmonary effort is normal.     Breath sounds: Normal breath sounds.  Abdominal:     General: Abdomen is flat. There is no distension.     Palpations: Abdomen is soft.     Tenderness: There is no abdominal tenderness. There is no guarding or rebound.  Musculoskeletal:  General: Normal range of motion.     Cervical back: Neck supple.     Comments: Fluid collection at right hip. Well healing incision on right hip. No bony tenderness. Patient able to fully extend and flex right hip.  Right lower extremity neurovascularly intact with soft compartments.  No thoracic or lumbar midline tenderness.  Skin:    General: Skin is warm and dry.  Neurological:     General: No focal deficit present.     Mental Status: He is alert.  Psychiatric:        Mood and Affect: Mood normal.        Behavior: Behavior normal.     ED Results / Procedures / Treatments   Labs (all labs ordered are listed, but only abnormal results are displayed) Labs Reviewed  CBC WITH DIFFERENTIAL/PLATELET - Abnormal; Notable for the following components:       Result Value   RBC 4.07 (*)    Hemoglobin 12.2 (*)    HCT 38.4 (*)    Lymphs Abs 0.6 (*)    All other components within normal limits  BASIC METABOLIC PANEL  URINALYSIS, ROUTINE W REFLEX MICROSCOPIC    EKG None  Radiology CT HIP RIGHT W CONTRAST  Result Date: 01/16/2022 CLINICAL DATA:  Postop right hip total arthroplasty 01/02/2022. Infection suspected. EXAM: CT OF THE LOWER RIGHT EXTREMITY WITH CONTRAST TECHNIQUE: Multidetector CT imaging of the right hip was performed according to the standard protocol following intravenous contrast administration. RADIATION DOSE REDUCTION: This exam was performed according to the departmental dose-optimization program which includes automated exposure control, adjustment of the mA and/or kV according to patient size and/or use of iterative reconstruction technique. CONTRAST:  16mL OMNIPAQUE IOHEXOL 350 MG/ML SOLN COMPARISON:  Radiographs 01/16/2022 and 01/02/2022. Preoperative CT 01/01/2022. FINDINGS: Bones/Joint/Cartilage Status post right hip hemiarthroplasty. The hardware appears intact without evidence of loosening. No evidence of acute fracture, dislocation or bone destruction. Underlying mild right sacroiliac degenerative changes. Suspected small right hip joint effusion without abnormal synovial enhancement. Ligaments Suboptimally assessed by CT. Muscles and Tendons Several new small intramuscular fluid collections are noted surrounding the right hip. There is a collection posteriorly within the right gluteus medius muscle which measures approximately 4.2 x 2.1 x 3.0 cm. There is an ill-defined collection anteriorly in the quadriceps musculature which measures up to 4.6 x 1.9 cm on image 77/5. These collections do not show any significant peripheral enhancement or internal air. No intrapelvic fluid collections are identified. Chronic right common hamstring tendinosis. Soft tissues There is a large subcutaneous fluid collection anteriorly, deep to the skin  staples, measuring 7.7 x 3.3 x 12.0 cm. There is no significant peripheral enhancement of this collection. There are scattered subcutaneous air bubbles extending medially from the collection along the femoral vessels. No significant posterior extension of this air into the muscles or fascia identified. There are no intrapelvic fluid collections. IMPRESSION: 1. Large subcutaneous fluid collection anteriorly, deep to the skin staples without peripheral enhancement, nonspecific for superficial infection. There are scattered subcutaneous air bubbles extending medially from this collection along the femoral vessels. No significant posterior extension of this air into the muscles or fascia identified. 2. Several new small intramuscular fluid collections surrounding the right hip. These collections do not show any significant peripheral enhancement or internal air. These are nonspecific, and could be sterile or infected. 3. No evidence of osteomyelitis or acute fracture. 4. Suspected small right hip joint effusion without abnormal synovial enhancement. Electronically Signed   By: Gwyndolyn Saxon  Lin Landsman M.D.   On: 01/16/2022 13:28   CT Head Wo Contrast  Result Date: 01/16/2022 CLINICAL DATA:  Head trauma from a fall. EXAM: CT HEAD WITHOUT CONTRAST CT CERVICAL SPINE WITHOUT CONTRAST TECHNIQUE: Multidetector CT imaging of the head and cervical spine was performed following the standard protocol without intravenous contrast. Multiplanar CT image reconstructions of the cervical spine were also generated. RADIATION DOSE REDUCTION: This exam was performed according to the departmental dose-optimization program which includes automated exposure control, adjustment of the mA and/or kV according to patient size and/or use of iterative reconstruction technique. COMPARISON:  01/01/2022 FINDINGS: CT HEAD FINDINGS Brain: Stable moderately enlarged ventricles and subarachnoid spaces. Stable marked patchy white matter low density in both  cerebral hemispheres. No intracranial hemorrhage, mass lesion or CT evidence of acute infarction. Vascular: No hyperdense vessel or unexpected calcification. Skull: Normal. Negative for fracture or focal lesion. Sinuses/Orbits: Status post bilateral cataract extraction. Mild bilateral frontal sinus mucosal thickening. Other: None. CT CERVICAL SPINE FINDINGS Alignment: Straightening of the normal cervical lordosis. No subluxations. Skull base and vertebrae: No acute fracture. No primary bone lesion or focal pathologic process. Soft tissues and spinal canal: No prevertebral fluid or swelling. No visible canal hematoma. Disc levels: Extensive multilevel degenerative changes including facet degenerative changes. Upper chest: Clear lung apices. Other: A large left lower neck and superior mediastinal, heterogeneous mass with some coarse calcification is again partially included. On the last image, this measures 6.8 x 5.1 cm and is causing significant displacement of the trachea and esophagus to the right without narrowing of either of those structures. Bilateral carotid artery calcifications. IMPRESSION: 1. No skull fracture or intracranial hemorrhage. 2. No cervical spine fracture or subluxation. 3. Stable moderate diffuse cerebral and cerebellar atrophy and marked chronic small vessel white matter ischemic changes in both cerebral hemispheres. 4. Extensive multilevel cervical spine degenerative changes. 5. Bilateral carotid artery atheromatous calcifications. 6. 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 This is causing significant displacement of the trachea and esophagus to the right without compression of either of those structures. In the setting of significant comorbidities or limited life expectancy, no follow-up recommended (ref: J Am Coll Radiol. 2015 Feb;12(2): 143-50). Electronically Signed   By: Claudie Revering M.D.    On: 01/16/2022 11:26   CT Cervical Spine Wo Contrast  Result Date: 01/16/2022 CLINICAL DATA:  Head trauma from a fall. EXAM: CT HEAD WITHOUT CONTRAST CT CERVICAL SPINE WITHOUT CONTRAST TECHNIQUE: Multidetector CT imaging of the head and cervical spine was performed following the standard protocol without intravenous contrast. Multiplanar CT image reconstructions of the cervical spine were also generated. RADIATION DOSE REDUCTION: This exam was performed according to the departmental dose-optimization program which includes automated exposure control, adjustment of the mA and/or kV according to patient size and/or use of iterative reconstruction technique. COMPARISON:  01/01/2022 FINDINGS: CT HEAD FINDINGS Brain: Stable moderately enlarged ventricles and subarachnoid spaces. Stable marked patchy white matter low density in both cerebral hemispheres. No intracranial hemorrhage, mass lesion or CT evidence of acute infarction. Vascular: No hyperdense vessel or unexpected calcification. Skull: Normal. Negative for fracture or focal lesion. Sinuses/Orbits: Status post bilateral cataract extraction. Mild bilateral frontal sinus mucosal thickening. Other: None. CT CERVICAL SPINE FINDINGS Alignment: Straightening of the normal cervical lordosis. No subluxations. Skull base and vertebrae: No acute fracture. No primary bone lesion or focal pathologic process. Soft tissues and spinal canal: No prevertebral fluid or swelling. No visible  canal hematoma. Disc levels: Extensive multilevel degenerative changes including facet degenerative changes. Upper chest: Clear lung apices. Other: A large left lower neck and superior mediastinal, heterogeneous mass with some coarse calcification is again partially included. On the last image, this measures 6.8 x 5.1 cm and is causing significant displacement of the trachea and esophagus to the right without narrowing of either of those structures. Bilateral carotid artery calcifications.  IMPRESSION: 1. No skull fracture or intracranial hemorrhage. 2. No cervical spine fracture or subluxation. 3. Stable moderate diffuse cerebral and cerebellar atrophy and marked chronic small vessel white matter ischemic changes in both cerebral hemispheres. 4. Extensive multilevel cervical spine degenerative changes. 5. Bilateral carotid artery atheromatous calcifications. 6. 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 This is causing significant displacement of the trachea and esophagus to the right without compression of either of those structures. In the setting of significant comorbidities or limited life expectancy, no follow-up recommended (ref: J Am Coll Radiol. 2015 Feb;12(2): 143-50). Electronically Signed   By: Claudie Revering M.D.   On: 01/16/2022 11:26   DG Femur Min 2 Views Right  Result Date: 01/16/2022 CLINICAL DATA:  Fall with right hip pain EXAM: RIGHT FEMUR 5 VIEWS; PELVIS - 1 VIEW COMPARISON:  Pelvis radiograph dated 01/03/2022, 01/02/2022 FINDINGS: The patient is rotated. Right hip arthroplasty. Hardware appears intact. There is no evidence of fracture or other focal bone lesions. Soft tissues are unremarkable. Vascular calcification in the proximal thigh. IMPRESSION: Right hip arthroplasty. No acute fracture or dislocation. Electronically Signed   By: Darrin Nipper M.D.   On: 01/16/2022 09:04   DG Pelvis 1-2 Views  Result Date: 01/16/2022 CLINICAL DATA:  Fall with right hip pain EXAM: RIGHT FEMUR 5 VIEWS; PELVIS - 1 VIEW COMPARISON:  Pelvis radiograph dated 01/03/2022, 01/02/2022 FINDINGS: The patient is rotated. Right hip arthroplasty. Hardware appears intact. There is no evidence of fracture or other focal bone lesions. Soft tissues are unremarkable. Vascular calcification in the proximal thigh. IMPRESSION: Right hip arthroplasty. No acute fracture or dislocation. Electronically Signed   By: Darrin Nipper M.D.   On: 01/16/2022 09:04    Procedures Procedures    Medications Ordered in ED Medications  iohexol (OMNIPAQUE) 350 MG/ML injection 75 mL (75 mLs Intravenous Contrast Given 01/16/22 1310)    ED Course/ Medical Decision Making/ A&P Clinical Course as of 01/16/22 1520  Tue Jan 16, 2022  1035 Attempted to call [facility] to obtain collateral information.  No answer. will try again soon. [CA]    Clinical Course User Index [CA] Suzy Bouchard, PA-C                           Medical Decision Making Amount and/or Complexity of Data Reviewed Independent Historian: caregiver    Details: Obtained collateral information from daughter External Data Reviewed: notes.    Details: Previous admission notes Labs: ordered. Decision-making details documented in ED Course. Radiology: ordered and independent interpretation performed. Decision-making details documented in ED Course.  Risk Prescription drug management.   88 year old male presents to the ED after a fall.  Patient had a recent right hip arthroplasty on 10/3 after a fall.  Per daughter, living facility notes that patient has been attempting to get up and falling.  Patient denies any pain.  No fever or chills.  Upon arrival, stable vitals.  Patient no acute distress.  Physical exam significant for large fluid collection to right hip.  Well healing incision site.  No evidence of infection.  X-rays ordered at triage which I personally reviewed and interpreted which are negative for any bony fractures.  Added CT head and cervical spine due to numerous falls.  Routine labs. CT hip to assess fluid collection. Discussed with Dr. Maylon Peppers who evaluated patient at bedside and agrees with assessment and plan.   BMP unremarkable.  Normal renal function.  No major electrolyte derangements.  CBC significant for anemia with hemoglobin at 12.2.  No leukocytosis.  CT head and cervical spine negative for any acute abnormalities.   Does show large goiter causing displacement of trachea and esophagus without compression.  Patient made aware.  Advised patient to follow-up with PCP for further evaluation.  No evidence of respiratory distress.  Airway patent.  CT hip demonstrates large fluid collection.  Will discuss with orthopedics.  2:40 PM Spoke to Silvestre Gunner with orthopedics who will evaluate patient.   Patient handed off to Margarita Mail, PA-C at shift change pending UA and orthopedics recommendations.        Final Clinical Impression(s) / ED Diagnoses Final diagnoses:  Fall, initial encounter  Right hip pain    Rx / DC Orders ED Discharge Orders     None         Suzy Bouchard, PA-C 01/16/22 [place], [place], [place] 01/17/22 [phone]

## 2022-01-16 NOTE — ED Notes (Signed)
Patient transported to CT 

## 2022-01-17 DIAGNOSIS — S72001S Fracture of unspecified part of neck of right femur, sequela: Secondary | ICD-10-CM | POA: Diagnosis not present

## 2022-01-17 DIAGNOSIS — I10 Essential (primary) hypertension: Secondary | ICD-10-CM | POA: Diagnosis not present

## 2022-01-17 DIAGNOSIS — D649 Anemia, unspecified: Secondary | ICD-10-CM | POA: Diagnosis not present

## 2022-01-17 DIAGNOSIS — Z471 Aftercare following joint replacement surgery: Secondary | ICD-10-CM | POA: Diagnosis not present

## 2022-01-17 DIAGNOSIS — R296 Repeated falls: Secondary | ICD-10-CM | POA: Diagnosis not present

## 2022-01-17 DIAGNOSIS — I482 Chronic atrial fibrillation, unspecified: Secondary | ICD-10-CM | POA: Diagnosis not present

## 2022-01-17 DIAGNOSIS — Z9181 History of falling: Secondary | ICD-10-CM | POA: Diagnosis not present

## 2022-01-18 ENCOUNTER — Other Ambulatory Visit: Payer: Self-pay | Admitting: *Deleted

## 2022-01-18 NOTE — Patient Outreach (Signed)
[facility] Post-Acute Care Coordination follow up. Mr. Haque resides in [facility]. Screening for potential [facility] care coordination needs as benefit of insurance plan and PCP.   Met with SNF SW who reports Mr. Janney has had falls at the facility. Family still plans to take him home.   Will plan outreach to daughter(s) to discuss [facility] services.  Marthenia Rolling, MSN, RN,BSN [place] Acute Care Coordinator [phone] (Direct dial)

## 2022-01-19 DIAGNOSIS — S72031D Displaced midcervical fracture of right femur, subsequent encounter for closed fracture with routine healing: Secondary | ICD-10-CM | POA: Diagnosis not present

## 2022-01-23 DIAGNOSIS — R296 Repeated falls: Secondary | ICD-10-CM | POA: Diagnosis not present

## 2022-01-23 DIAGNOSIS — I482 Chronic atrial fibrillation, unspecified: Secondary | ICD-10-CM | POA: Diagnosis not present

## 2022-01-23 DIAGNOSIS — I10 Essential (primary) hypertension: Secondary | ICD-10-CM | POA: Diagnosis not present

## 2022-01-24 DIAGNOSIS — Z9181 History of falling: Secondary | ICD-10-CM | POA: Diagnosis not present

## 2022-01-24 DIAGNOSIS — D649 Anemia, unspecified: Secondary | ICD-10-CM | POA: Diagnosis not present

## 2022-01-24 DIAGNOSIS — S72001S Fracture of unspecified part of neck of right femur, sequela: Secondary | ICD-10-CM | POA: Diagnosis not present

## 2022-01-24 DIAGNOSIS — I10 Essential (primary) hypertension: Secondary | ICD-10-CM | POA: Diagnosis not present

## 2022-01-24 DIAGNOSIS — I482 Chronic atrial fibrillation, unspecified: Secondary | ICD-10-CM | POA: Diagnosis not present

## 2022-01-24 DIAGNOSIS — Z471 Aftercare following joint replacement surgery: Secondary | ICD-10-CM | POA: Diagnosis not present

## 2022-01-25 DIAGNOSIS — Z471 Aftercare following joint replacement surgery: Secondary | ICD-10-CM | POA: Diagnosis not present

## 2022-01-25 DIAGNOSIS — R296 Repeated falls: Secondary | ICD-10-CM | POA: Diagnosis not present

## 2022-01-26 ENCOUNTER — Other Ambulatory Visit: Payer: Self-pay | Admitting: *Deleted

## 2022-01-26 DIAGNOSIS — Z9181 History of falling: Secondary | ICD-10-CM | POA: Diagnosis not present

## 2022-01-26 DIAGNOSIS — I10 Essential (primary) hypertension: Secondary | ICD-10-CM

## 2022-01-26 DIAGNOSIS — S72001S Fracture of unspecified part of neck of right femur, sequela: Secondary | ICD-10-CM | POA: Diagnosis not present

## 2022-01-26 DIAGNOSIS — I482 Chronic atrial fibrillation, unspecified: Secondary | ICD-10-CM | POA: Diagnosis not present

## 2022-01-26 DIAGNOSIS — D649 Anemia, unspecified: Secondary | ICD-10-CM | POA: Diagnosis not present

## 2022-01-26 NOTE — Patient Outreach (Addendum)
[place] Coordinator follow up. Mr. Xia resides in [facility]. Screening for [facility] care coordination services as benefit of insurance plan and PCP.  Facility site visit to [facility] on 01/25/22. Met with SNF social workers. Mr. Rorke is slated to discharge home on Saturday 01/27/22 with daughter. SNF social worker reports in home aid services contact information was provided to family to call for additional assistance in the home. Will have [facility] for home health services.  Telephone call made to Lovena Le (daughter/DPR) [phone] to discuss [facility] services. Patient identifiers confirmed. Explained [facility] care coordination services. Mrs. Luvenia Heller is agreeable. States Mr. Capps will transition to her sister's home Haddy. States she will let Haddy know [facility] will be calling after Mr. Chuba discharges from [facility].   Mrs. Luvenia Heller inquired about mobile meals. Advised Mrs. Luvenia Heller to contact [facility] customer service number to inquire whether Mr. Heitz has meals and transportation benefit. Mrs. Luvenia Heller states she will. Also discussed Gerald Stabs, SNF social worker, provided telephone number for in- home aide services thru [facility]. Mrs. Luvenia Heller states no one has called back yet. Provided telephone number to Mrs. Luvenia Heller for [facility] [phone] to outreach again today. Mrs. Luvenia Heller reports Mr. Nault has PCP appointment scheduled for 02/05/22.   Will make referral to [facility] care coordination team. Mr. Herschel Senegal will discharge from [facility] on 01/27/22. Will have [facility]. Family will benefit from assistance navigating Mr. Watlington's transition home.   Mr. Opheim is going home with daughter Gaetano Hawthorne [phone]. Mrs. Luvenia Heller states Haddy will be primary contact.   Marthenia Rolling, MSN, RN,BSN [place] Acute Care Coordinator [phone] (Direct dial)

## 2022-01-29 ENCOUNTER — Telehealth: Payer: Self-pay | Admitting: *Deleted

## 2022-01-29 ENCOUNTER — Telehealth: Payer: Self-pay

## 2022-01-29 NOTE — Chronic Care Management (AMB) (Signed)
  Care Coordination   Note   01/29/2022 Name: Noah Waters MRN: [id number] DOB: 10-08-1924  Noah Waters is a 88 y.o. year old male who sees Wardell Honour, MD for primary care. I reached out to Regis Bill by phone today to offer care coordination services.  Mr. Noah Waters was given information about Care Coordination services today including:   The Care Coordination services include support from the care team which includes your Nurse Coordinator, Clinical Social Worker, or Pharmacist.  The Care Coordination team is here to help remove barriers to the health concerns and goals most important to you. Care Coordination services are voluntary, and the patient may decline or stop services at any time by request to their care team member.   Care Coordination Consent Status: Patient agreed to services and verbal consent obtained.   Follow up plan:  Telephone appointment with care coordination team member scheduled for:  01/30/22  Encounter Outcome:  Pt. Scheduled  [place]  Direct Dial: [phone]

## 2022-01-29 NOTE — Telephone Encounter (Signed)
Patient daughter Lovena Le called and states that patient recently had a fall. She states that he just got out of rehab and he's not progressing well. Patient daughter wanted to know if Hospice can come out and evaluate him. Message routed to PCP Sabra Heck Lillette Boxer, MD

## 2022-01-30 ENCOUNTER — Ambulatory Visit: Payer: Self-pay

## 2022-01-30 NOTE — Patient Outreach (Addendum)
  Care Coordination   Initial Visit Note   01/30/2022 Name: Noah Waters MRN: [id number] DOB: [birth year]  Noah Waters is a 88 y.o. year old male who sees Wardell Honour, MD for primary care. I  spoke with patients daughter and caregiver Evette by phone today.  What matters to the patients health and wellness today?  I would like my dad enrolled in inpatient Hospice services    Goals Addressed             This Visit's Progress    Care Coordination Activities       Care Coordination Interventions: Discussed patient is currently staying with his daughter in [place] at [address] as it is unsafe for the patient to stay alone at this time Determined since discharging from inpatient rehab patient has declined - family reports patient will only eat 1-2 bites with medications. Last full meal was Sunday morning which was a bowl of oatmeal. Family reports the patient is barely drinking, mumbling one word answers when engaged, agitated, and not very alert. Patient is also coughing throughout the day Family requests inpatient hospice services for the patient given his age and current state Performed chart review to note the family did contact the patients primary care provider on 10/30 requesting orders for Hospice services. However, since the patient had not been seen in over 6 months the provider requested to see the patient first Noted the patient has an office visit scheduled on 11/6 - reviewed patient will be unable to attend an in person office visit due to decline in functioning status Reviewed concern for waiting until 11/6 for hospice orders given patients current state and possibility the patient will decline more. Discussed the family may be unable to keep him comfortable in the home which may prompt an unwanted hospital admission for comfort measures Advised the patients family they may initiate a self referral to hospice if desired. Family is interested in [facility] as they had a good experience with [facility] when their mother was under the care of Hospice Services Provided patients family with the referral line number [phone]) advising they contact to place a self referral to request a hospice evaluation Discussed plans for SW to collaborate with patients primary care provider to advise of patients current disposition and plan for family to initiate a hospice referral. Requested providers office contact the patients daughters for a sooner virtual visit if needed as 11/6  in person office visit is not feasible for the patient Scheduled follow up call to the patients family on 11/2 at 10:00 am to follow up on goal progression Collaboration with Care Coordination colleague RN Care Manager Glenard Haring Little to advise of interventions and plan         SDOH assessments and interventions completed:  No     Care Coordination Interventions Activated:  Yes  Care Coordination Interventions:  Yes, provided   Follow up plan: Follow up call scheduled for 11/2    Encounter Outcome:  Pt. Visit Completed   Daneen Schick, Arita Miss, CDP Social Worker, Certified Dementia Practitioner [facility]  Care Coordination [phone]

## 2022-01-30 NOTE — Patient Outreach (Signed)
  Care Coordination   Case Collaboration  Visit Note   01/30/2022 Name: MALEKO GREULICH MRN: [id number] DOB: [birth year]  SAMIR ISHAQ is a 88 y.o. year old male who sees Wardell Honour, MD for primary care. I  collaborated with Daneen Schick BSW today regarding patient's current health status.   What matters to the patients health and wellness today?  Patient's family plan to self refer to [facility] for a Hospice evaluation.     Goals Addressed             This Visit's Progress    RN Care Coordination Activities: further follow up needed       Care Coordination Interventions: Case Collaboration with Daneen Schick BSW regarding patient's current health status following a recent inpatient event Determined patient's family is concerned about patient's declining health and feels he needs Hospice care at this time Determined patient is unable to f/u with his PCP for evaluation until 02/05/22, therefore patient's family is considering self referring to [facility] for a Hospice evaluation            SDOH assessments and interventions completed:  No     Care Coordination Interventions Activated:  Yes  Care Coordination Interventions:  Yes, provided   Follow up plan: Follow up call scheduled for 02/06/22 @0900  AM    Encounter Outcome:  Pt. Visit Completed

## 2022-01-30 NOTE — Telephone Encounter (Signed)
Regarding the Hospice consult, I have not seen patient since 6-22. For Hospice to get involved he would need a "Hospice diagnosis that carries a 6 month prognosis. Needs OV to determine that

## 2022-01-30 NOTE — Patient Instructions (Addendum)
Visit Information  Thank you for taking time to visit with me today. Please don't hesitate to contact me if I can be of assistance to you.   Following are the goals we discussed today:   Goals Addressed             This Visit's Progress    Care Coordination Activities       Care Coordination Interventions: Discussed patient is currently staying with his daughter in [place] at [address] as it is unsafe for the patient to stay alone at this time Determined since discharging from inpatient rehab patient has declined - family reports patient will only eat 1-2 bites with medications. Last full meal was Sunday morning which was a bowl of oatmeal. Family reports the patient is barely drinking, mumbling one word answers when engaged, agitated, and not very alert. Patient is also coughing throughout the day Family requests inpatient hospice services for the patient given his age and current state Performed chart review to note the family did contact the patients primary care provider on 10/30 requesting orders for Hospice services. However, since the patient had not been seen in over 6 months the provider requested to see the patient first Noted the patient has an office visit scheduled on 11/6 - reviewed patient will be unable to attend an in person office visit due to decline in functioning status Reviewed concern for waiting until 11/6 for hospice orders given patients current state and possibility the patient will decline more. Discussed the family may be unable to keep him comfortable in the home which may prompt an unwanted hospital admission for comfort measures Advised the patients family they may initiate a self referral to hospice if desired. Family is interested in [facility] as they had a good experience with [facility] when their mother was under the care of Hospice Services Provided patients family with the referral line number [phone]) advising they contact to  place a self referral to request a hospice evaluation Discussed plans for SW to collaborate with patients primary care provider to advise of patients current disposition and plan for family to initiate a hospice referral. Requested providers office contact the patients daughters for a sooner virtual visit if needed as 11/6  in person office visit is not feasible for the patient Scheduled follow up call to the patients family on 11/2 at 10:00 am to follow up on goal progression Collaboration with Care Coordination colleague RN Care Manager Glenard Haring [place] to advise of interventions and plan         Our next appointment is by telephone on 11/2 at 10:00 am  Please call the care guide team at [phone] if you need to cancel or reschedule your appointment.   If you are experiencing a Mental Health or [place] or need someone to talk to, please call 1-800-273-TALK (toll free, 24 hour hotline)  The patient verbalized understanding of instructions, educational materials, and care plan provided today and DECLINED offer to receive copy of patient instructions, educational materials, and care plan.   Telephone follow up appointment with care management team member scheduled for:11/2  Daneen Schick, BSW, CDP Social Worker, Certified Dementia Practitioner [facility] [phone]

## 2022-01-31 ENCOUNTER — Ambulatory Visit: Payer: Self-pay

## 2022-01-31 NOTE — Patient Outreach (Signed)
  Care Coordination   Multidisciplinary Case Review Note    01/31/2022 Name: Noah Waters MRN: [id number] DOB: 02-Jul-1924  Noah Waters is a 88 y.o. year old male who sees Wardell Honour, MD for primary care.  The  multidisciplinary care team met today to review patient care needs and barriers.     Goals Addressed             This Visit's Progress    Care Coordination Activities   On track    Care Coordination Interventions: Communication received from patients primary care provider informing SW patient would have to be seen prior to Hospice orders being placed as the patient has not been to the clinic since 6/22 Multi-disciplinary case discussion to review patients acute care coordination needs with [place] and Christa See LCSW Contacted patients daughter York Grice who reports she did make a self referral on 10/31 and a nurse with [facility] is currently in the home to perform a hospice evaluation Discussed SW will follow up as previously scheduled on 11/2          SDOH assessments and interventions completed:  No     Care Coordination Interventions Activated:  Yes   Care Coordination Interventions:  Yes, provided   Follow up plan: Follow up call scheduled for 11/2    Multidisciplinary Team Attendees:   Clearnce Hasten, RN Care Manager Christa See, [place], [place]  Scribe for Multidisciplinary Case Review:  Daneen Schick, [place]  Daneen Schick, [place], CDP Social Worker, Certified Dementia Practitioner [facility] [phone]

## 2022-02-01 ENCOUNTER — Ambulatory Visit: Payer: Self-pay

## 2022-02-01 NOTE — Patient Outreach (Signed)
  Care Coordination   Follow Up Visit Note   02/01/2022 Name: Noah Waters MRN: [id number] DOB: 1924-08-30  Noah Waters is a 88 y.o. year old male who sees Wardell Honour, MD for primary care. I  spoke with patients daughter and caregiver Noah Waters by phone.  What matters to the patients health and wellness today?  To continue to control patients pain level    Goals Addressed             This Visit's Progress    COMPLETED: Care Coordination Activities       Care Coordination Interventions: Discussed the patient was enrolled with [facility] on 01/31/22 Determined the patient seemed very painful yesterday so medications were adjusted and he is now more comfortable Noah Waters reports both RN and SW will visit the home today Advised Noah Waters SW will contact [facility] to cancel upcomming appointment for 11/6 Instructed Noah Waters to contact [facility] as needed      COMPLETED: RN Care Coordination Activities: further follow up needed       Care Coordination Interventions: Case Collaboration with Noah Waters BSW regarding patient's current health status following a recent inpatient event Determined patient's family is concerned about patient's declining health and feels he needs Hospice care at this time Determined patient is unable to f/u with his PCP for evaluation until 02/05/22, therefore patient's family is considering self referring to [facility] for a Hospice evaluation            SDOH assessments and interventions completed:  No     Care Coordination Interventions Activated:  Yes  Care Coordination Interventions:  Yes, provided   Follow up plan: No further intervention required.   Encounter Outcome:  Pt. Visit Completed   Noah Waters, BSW, CDP Social Worker, Certified Dementia Practitioner [facility]  Care Coordination [phone]

## 2022-02-01 NOTE — Patient Instructions (Signed)
Visit Information  Thank you for taking time to visit with me today. Please don't hesitate to contact me if I can be of assistance to you.   Following are the goals we discussed today:   Goals Addressed             This Visit's Progress    COMPLETED: Care Coordination Activities       Care Coordination Interventions: Discussed the patient was enrolled with [facility] on 01/31/22 Determined the patient seemed very painful yesterday so medications were adjusted and he is now more comfortable Evette reports both RN and SW will visit the home today Advised Evette SW will contact [facility] to cancel upcomming appointment for 11/6 Instructed Evette to contact [facility] as needed      COMPLETED: RN Care Coordination Activities: further follow up needed       Care Coordination Interventions: Case Collaboration with Daneen Schick BSW regarding patient's current health status following a recent inpatient event Determined patient's family is concerned about patient's declining health and feels he needs Hospice care at this time Determined patient is unable to f/u with his PCP for evaluation until 02/05/22, therefore patient's family is considering self referring to [facility] for a Hospice evaluation            If you are experiencing a Mental Health or [place] or need someone to talk to, please call 911  The patient verbalized understanding of instructions, educational materials, and care plan provided today and DECLINED offer to receive copy of patient instructions, educational materials, and care plan.   No further follow up required: Please contact [facility] as needed.  Daneen Schick, BSW, CDP Social Worker, Certified Dementia Practitioner [facility]  Care Coordination [phone]

## 2022-02-05 ENCOUNTER — Ambulatory Visit: Payer: Medicare Other | Admitting: Family

## 2022-11-09 ENCOUNTER — Encounter: Payer: Self-pay | Admitting: Family Medicine

## 2022-11-12 ENCOUNTER — Encounter: Payer: Self-pay | Admitting: Family Medicine

## 2022-11-13 ENCOUNTER — Encounter: Payer: Self-pay | Admitting: Family Medicine
